# Patient Record
Sex: Female | Born: 1950
Health system: Southern US, Community
[De-identification: ages and names within clinical notes are randomized; demographics above are authoritative.]

## PROBLEM LIST (undated history)

## (undated) DIAGNOSIS — K3580 Unspecified acute appendicitis: Secondary | ICD-10-CM

## (undated) DIAGNOSIS — E079 Disorder of thyroid, unspecified: Secondary | ICD-10-CM

## (undated) HISTORY — DX: Disorder of thyroid, unspecified: E07.9

## (undated) HISTORY — PX: ABDOMINAL HYSTERECTOMY: SHX81

---

## 2002-04-09 ENCOUNTER — Other Ambulatory Visit: Admission: RE | Admit: 2002-04-09 | Discharge: 2002-04-09 | Payer: Self-pay | Admitting: Obstetrics and Gynecology

## 2004-01-23 ENCOUNTER — Ambulatory Visit (HOSPITAL_BASED_OUTPATIENT_CLINIC_OR_DEPARTMENT_OTHER): Admission: RE | Admit: 2004-01-23 | Discharge: 2004-01-23 | Payer: Self-pay | Admitting: Otolaryngology

## 2004-01-23 ENCOUNTER — Ambulatory Visit (HOSPITAL_COMMUNITY): Admission: RE | Admit: 2004-01-23 | Discharge: 2004-01-23 | Payer: Self-pay | Admitting: Otolaryngology

## 2005-08-19 ENCOUNTER — Ambulatory Visit (HOSPITAL_COMMUNITY): Admission: RE | Admit: 2005-08-19 | Discharge: 2005-08-19 | Payer: Self-pay | Admitting: *Deleted

## 2010-08-22 ENCOUNTER — Encounter: Payer: Self-pay | Admitting: Internal Medicine

## 2012-06-07 ENCOUNTER — Other Ambulatory Visit: Payer: Self-pay | Admitting: Internal Medicine

## 2013-06-06 ENCOUNTER — Other Ambulatory Visit: Payer: Self-pay | Admitting: Internal Medicine

## 2013-06-06 DIAGNOSIS — R7989 Other specified abnormal findings of blood chemistry: Secondary | ICD-10-CM

## 2013-06-10 ENCOUNTER — Ambulatory Visit
Admission: RE | Admit: 2013-06-10 | Discharge: 2013-06-10 | Disposition: A | Discharge status: Home / Self Care | Payer: 59 | Source: Ambulatory Visit | Attending: Internal Medicine | Admitting: Internal Medicine

## 2013-06-10 DIAGNOSIS — R7989 Other specified abnormal findings of blood chemistry: Secondary | ICD-10-CM

## 2015-04-08 ENCOUNTER — Encounter: Payer: Self-pay | Admitting: Physician Assistant

## 2015-04-08 ENCOUNTER — Ambulatory Visit (INDEPENDENT_AMBULATORY_CARE_PROVIDER_SITE_OTHER): Payer: BLUE CROSS/BLUE SHIELD | Admitting: Physician Assistant

## 2015-04-08 VITALS — BP 128/82 | HR 65 | Ht 67.5 in | Wt 165.0 lb

## 2015-04-08 DIAGNOSIS — E039 Hypothyroidism, unspecified: Secondary | ICD-10-CM | POA: Diagnosis not present

## 2015-04-08 DIAGNOSIS — Z131 Encounter for screening for diabetes mellitus: Secondary | ICD-10-CM

## 2015-04-08 DIAGNOSIS — Z13220 Encounter for screening for lipoid disorders: Secondary | ICD-10-CM | POA: Diagnosis not present

## 2015-04-08 DIAGNOSIS — R600 Localized edema: Secondary | ICD-10-CM | POA: Insufficient documentation

## 2015-04-08 DIAGNOSIS — Z1239 Encounter for other screening for malignant neoplasm of breast: Secondary | ICD-10-CM

## 2015-04-08 DIAGNOSIS — Z23 Encounter for immunization: Secondary | ICD-10-CM

## 2015-04-08 DIAGNOSIS — Z114 Encounter for screening for human immunodeficiency virus [HIV]: Secondary | ICD-10-CM

## 2015-04-08 DIAGNOSIS — Z1159 Encounter for screening for other viral diseases: Secondary | ICD-10-CM

## 2015-04-08 DIAGNOSIS — Z0000 Encounter for general adult medical examination without abnormal findings: Secondary | ICD-10-CM

## 2015-04-08 LAB — CBC WITH DIFFERENTIAL/PLATELET
BASOS ABS: 0 10*3/uL (ref 0.0–0.1)
Basophils Relative: 0 % (ref 0–1)
EOS PCT: 2 % (ref 0–5)
Eosinophils Absolute: 0.1 10*3/uL (ref 0.0–0.7)
HEMATOCRIT: 35.6 % — AB (ref 36.0–46.0)
Hemoglobin: 11.7 g/dL — ABNORMAL LOW (ref 12.0–15.0)
LYMPHS ABS: 2 10*3/uL (ref 0.7–4.0)
LYMPHS PCT: 37 % (ref 12–46)
MCH: 29.1 pg (ref 26.0–34.0)
MCHC: 32.9 g/dL (ref 30.0–36.0)
MCV: 88.6 fL (ref 78.0–100.0)
MPV: 9.5 fL (ref 8.6–12.4)
Monocytes Absolute: 0.6 10*3/uL (ref 0.1–1.0)
Monocytes Relative: 11 % (ref 3–12)
NEUTROS PCT: 50 % (ref 43–77)
Neutro Abs: 2.7 10*3/uL (ref 1.7–7.7)
PLATELETS: 290 10*3/uL (ref 150–400)
RBC: 4.02 MIL/uL (ref 3.87–5.11)
RDW: 13.6 % (ref 11.5–15.5)
WBC: 5.3 10*3/uL (ref 4.0–10.5)

## 2015-04-08 LAB — LIPID PANEL
CHOL/HDL RATIO: 4.5 ratio (ref <=5.0)
Cholesterol: 227 mg/dL — ABNORMAL HIGH (ref 125–200)
HDL: 51 mg/dL (ref >=46)
LDL CALC: 154 mg/dL — AB (ref <130)
TRIGLYCERIDES: 110 mg/dL (ref <150)
VLDL: 22 mg/dL (ref <30)

## 2015-04-08 LAB — COMPLETE METABOLIC PANEL WITH GFR
ALT: 16 U/L (ref 6–29)
AST: 20 U/L (ref 10–35)
Albumin: 4.2 g/dL (ref 3.6–5.1)
Alkaline Phosphatase: 65 U/L (ref 33–130)
BILIRUBIN TOTAL: 0.4 mg/dL (ref 0.2–1.2)
BUN: 14 mg/dL (ref 7–25)
CHLORIDE: 102 mmol/L (ref 98–110)
CO2: 24 mmol/L (ref 20–31)
Calcium: 9.7 mg/dL (ref 8.6–10.4)
Creat: 1.11 mg/dL — ABNORMAL HIGH (ref 0.50–0.99)
GFR, EST AFRICAN AMERICAN: 62 mL/min (ref >=60)
GFR, EST NON AFRICAN AMERICAN: 53 mL/min — AB (ref >=60)
GLUCOSE: 86 mg/dL (ref 65–99)
POTASSIUM: 3.7 mmol/L (ref 3.5–5.3)
SODIUM: 140 mmol/L (ref 135–146)
Total Protein: 6.8 g/dL (ref 6.1–8.1)

## 2015-04-08 LAB — TSH: TSH: 2.029 u[IU]/mL (ref 0.350–4.500)

## 2015-04-08 NOTE — Progress Notes (Signed)
   Subjective:    Patient ID: Susan Figueroa, female    DOB: January 08, 1951, 64 y.o.   MRN: [id number]  HPI Patient is a 64 year old female who presents to the clinic to establish care.  She has no ongoing concerns today. She is just here to establish a PCP and make sure her health maintenance is up-to-date.  . Active Ambulatory Problems    Diagnosis Date Noted  . Thyroid activity decreased 04/08/2015  . Lower leg edema 04/08/2015   Resolved Ambulatory Problems    Diagnosis Date Noted  . No Resolved Ambulatory Problems   Past Medical History  Diagnosis Date  . Thyroid disease      ..  .. Family History  Problem Relation Age of Onset  . Cancer Mother     cervical  . Hypertension Brother   . Stroke Maternal Grandmother   . Heart attack Maternal Grandfather    .[name] Social History   Social History  . Marital Status: Married    Spouse Name: N/A  . Number of Children: N/A  . Years of Education: N/A   Occupational History  . Not on file.   Social History Main Topics  . Smoking status: Former Research scientist (life sciences)  . Smokeless tobacco: Not on file  . Alcohol Use: No  . Drug Use: No  . Sexual Activity: Yes   Other Topics Concern  . Not on file   Social History Narrative  . No narrative on file      Review of Systems  All other systems reviewed and are negative.      Objective:   Physical Exam  Constitutional: She is oriented to person, place, and time. She appears well-nourished.  HENT:  Head: Normocephalic and atraumatic.  Neck: Normal range of motion. Neck supple. No thyromegaly present.  Cardiovascular: Normal rate, regular rhythm and normal heart sounds.   Pulmonary/Chest: Effort normal and breath sounds normal.  Neurological: She is alert and oriented to person, place, and time.  Skin: Skin is dry.  Psychiatric: She has a normal mood and affect. Her behavior is normal.          Assessment & Plan:  Hypothyroidism-we'll check TSH today and adjust meds  accordingly.  Lower leg edema-well-controlled today. Patient takes HCTZ daily. Will refill as needed.  Flu shot andTdap given today.   Mammogram was ordered today. Patient aware she needs a Pap smear. Next year she will be due for colonoscopy.  Screening labs ordered for patient as well as fasting lipid panel.

## 2015-04-09 LAB — VITAMIN D 25 HYDROXY (VIT D DEFICIENCY, FRACTURES): Vit D, 25-Hydroxy: 38 ng/mL (ref 30–100)

## 2015-04-09 LAB — HIV ANTIBODY (ROUTINE TESTING W REFLEX): HIV 1&2 Ab, 4th Generation: NONREACTIVE

## 2015-04-09 LAB — HEPATITIS C ANTIBODY: HCV Ab: NEGATIVE

## 2015-04-10 ENCOUNTER — Encounter: Payer: Self-pay | Admitting: Physician Assistant

## 2015-04-10 DIAGNOSIS — E785 Hyperlipidemia, unspecified: Secondary | ICD-10-CM | POA: Insufficient documentation

## 2015-04-10 DIAGNOSIS — N183 Chronic kidney disease, stage 3 unspecified: Secondary | ICD-10-CM | POA: Insufficient documentation

## 2015-05-04 ENCOUNTER — Ambulatory Visit (INDEPENDENT_AMBULATORY_CARE_PROVIDER_SITE_OTHER): Payer: BLUE CROSS/BLUE SHIELD | Admitting: Physician Assistant

## 2015-05-04 ENCOUNTER — Encounter: Payer: Self-pay | Admitting: Physician Assistant

## 2015-05-04 VITALS — BP 127/74 | HR 62 | Ht 67.5 in | Wt 162.0 lb

## 2015-05-04 DIAGNOSIS — R5383 Other fatigue: Secondary | ICD-10-CM | POA: Diagnosis not present

## 2015-05-04 DIAGNOSIS — I7300 Raynaud's syndrome without gangrene: Secondary | ICD-10-CM

## 2015-05-04 DIAGNOSIS — Z0000 Encounter for general adult medical examination without abnormal findings: Secondary | ICD-10-CM | POA: Diagnosis not present

## 2015-05-04 DIAGNOSIS — R0683 Snoring: Secondary | ICD-10-CM

## 2015-05-04 NOTE — Patient Instructions (Addendum)
Keeping You Healthy  Get These Tests  Blood Pressure- Have your blood pressure checked by your healthcare provider at least once a year.  Normal blood pressure is 120/80.  Weight- Have your body mass index (BMI) calculated to screen for obesity.  BMI is a measure of body fat based on height and weight.  You can calculate your own BMI at www.nhlbisupport.com/bmi/  Cholesterol- Have your cholesterol checked every year.  Diabetes- Have your blood sugar checked every year if you have high blood pressure, high cholesterol, a family history of diabetes or if you are overweight.  Pap Test - Have a pap test every 1 to 5 years if you have been sexually active.  If you are older than 65 and recent pap tests have been normal you may not need additional pap tests.  In addition, if you have had a hysterectomy  for benign disease additional pap tests are not necessary.  Mammogram-Yearly mammograms are essential for early detection of breast cancer  Screening for Colon Cancer- Colonoscopy starting at age 50. Screening may begin sooner depending on your family history and other health conditions.  Follow up colonoscopy as directed by your Gastroenterologist.  Screening for Osteoporosis- Screening begins at age 65 with bone density scanning, sooner if you are at higher risk for developing Osteoporosis.  Get these medicines  Calcium with Vitamin D- Your body requires 1200-1500 mg of Calcium a day and 800-1000 IU of Vitamin D a day.  You can only absorb 500 mg of Calcium at a time therefore Calcium must be taken in 2 or 3 separate doses throughout the day.  Hormones- Hormone therapy has been associated with increased risk for certain cancers and heart disease.  Talk to your healthcare provider about if you need relief from menopausal symptoms.  Aspirin- Ask your healthcare provider about taking Aspirin to prevent Heart Disease and Stroke.  Get these Immuniztions  Flu shot- Every fall  Pneumonia shot-  Once after the age of 65; if you are younger ask your healthcare provider if you need a pneumonia shot.  Tetanus- Every ten years.  Zostavax- Once after the age of 60 to prevent shingles.  Take these steps  Don't smoke- Your healthcare provider can help you quit. For tips on how to quit, ask your healthcare provider or go to www.smokefree.gov or call 1-800 QUIT-NOW.  Be physically active- Exercise 5 days a week for a minimum of 30 minutes.  If you are not already physically active, start slow and gradually work up to 30 minutes of moderate physical activity.  Try walking, dancing, bike riding, swimming, etc.  Eat a healthy diet- Eat a variety of healthy foods such as fruits, vegetables, whole grains, low fat milk, low fat cheeses, yogurt, lean meats, chicken, fish, eggs, dried beans, tofu, etc.  For more information go to www.thenutritionsource.org  Dental visit- Brush and floss teeth twice daily; visit your dentist twice a year.  Eye exam- Visit your Optometrist or Ophthalmologist yearly.  Drink alcohol in moderation- Limit alcohol intake to one drink or less a day.  Never drink and drive.  Depression- Your emotional health is as important as your physical health.  If you're feeling down or losing interest in things you normally enjoy, please talk to your healthcare provider.  Seat Belts- can save your life; always wear one  Smoke/Carbon Monoxide detectors- These detectors need to be installed on the appropriate level of your home.  Replace batteries at least once a year.  Violence- If   anyone is threatening or hurting you, please tell your healthcare provider.  Living Will/ Health care power of attorney- Discuss with your healthcare provider and family.  Raynaud's Syndrome Raynaud's Syndrome is a disorder of the blood vessels in your hands and feet. It occurs when small arteries of the arms/hands or legs/feet become sensitive to cold or emotional upset. This causes the arteries to  constrict, or narrow, and reduces blood flow to the area. The color in the fingers or toes changes from white to bluish to red and this is not usually painful. There may be numbness and tingling. Sores on the skin (ulcers) can form. Symptoms are usually relieved by warming. HOME CARE INSTRUCTIONS   Avoid exposure to cold. Keep your whole body warm and dry. Dress in layers. Wear mittens or gloves when handling ice or frozen food and when outdoors. Use holders for glasses or cans containing cold drinks. If possible, stay indoors during cold weather.  Limit your use of caffeine. Switch to decaffeinated coffee, tea, and soda pop. Avoid chocolate.  Avoid smoking or being around cigarette smoke. Smoke will make symptoms worse.  Wear loose fitting socks and comfortable, roomy shoes.  Avoid vibrating tools and machinery.  If possible, avoid stressful and emotional situations. Exercise, meditation and yoga may help you cope with stress. Biofeedback may be useful.  Ask your caregiver about medicine (calcium channel blockers) that may control Raynaud's phenomena. SEEK MEDICAL CARE IF:   Your discomfort becomes worse, despite conservative treatment.  You develop sores on your fingers and toes that do not heal. Document Released: 07/15/2000 Document Revised: 10/10/2011 Document Reviewed: 07/22/2008 [facility] Patient Information 2015 [place], [place]. This information is not intended to replace advice given to you by your health care provider. Make sure you discuss any questions you have with your health care provider.

## 2015-05-04 NOTE — Progress Notes (Signed)
Subjective:    Patient ID: Susan Figueroa, female    DOB: 1951/06/06, 64 y.o.   MRN: [id number]  HPI    Review of Systems     Objective:   Physical Exam        Assessment & Plan:   Subjective:     Susan Figueroa is a 64 y.o. female and is here for a comprehensive physical exam. The patient reports problems - pt is concerned about sleep apnea. she snores and has no energy. husband recently dx and feels much better. she also has cold toes all the time. sometimes they tingle a little. keeping warm makes them a little better. .  Social History   Social History  . Marital Status: Married    Spouse Name: N/A  . Number of Children: N/A  . Years of Education: N/A   Occupational History  . Not on file.   Social History Main Topics  . Smoking status: Former Research scientist (life sciences)  . Smokeless tobacco: Not on file  . Alcohol Use: No  . Drug Use: No  . Sexual Activity: Yes   Other Topics Concern  . Not on file   Social History Narrative   Health Maintenance  Topic Date Due  . MAMMOGRAM  11/07/2002  . COLONOSCOPY  08/02/2015  . INFLUENZA VACCINE  03/01/2016  . TETANUS/TDAP  04/07/2025  . ZOSTAVAX  Addressed  . Hepatitis C Screening  Completed  . HIV Screening  Completed    The following portions of the patient's history were reviewed and updated as appropriate: allergies, current medications, past family history, past medical history, past social history, past surgical history and problem list.  Review of Systems Pertinent items noted in HPI and remainder of comprehensive ROS otherwise negative.   Objective:    BP 127/74 mmHg  Pulse 62  Ht 5' 7.5" (1.715 m)  Wt 162 lb (73.483 kg)  BMI 24.98 kg/m2 General appearance: alert, cooperative and appears stated age Head: Normocephalic, without obvious abnormality, atraumatic Eyes: conjunctivae/corneas clear. PERRL, EOM's intact. Fundi benign. Ears: normal TM's and external ear canals both ears Nose: Nares normal. Septum midline.  Mucosa normal. No drainage or sinus tenderness. Throat: lips, mucosa, and tongue normal; teeth and gums normal Neck: no adenopathy, no carotid bruit, no JVD, supple, symmetrical, trachea midline and thyroid not enlarged, symmetric, no tenderness/mass/nodules Back: symmetric, no curvature. ROM normal. No CVA tenderness. Lungs: clear to auscultation bilaterally Heart: regular rate and rhythm, S1, S2 normal, no murmur, click, rub or gallop Abdomen: soft, non-tender; bowel sounds normal; no masses,  no organomegaly Pelvic: external genitalia normal, no adnexal masses or tenderness, no cervical motion tenderness, uterus surgically absent and vagina normal without discharge Extremities: extremities normal, atraumatic, no cyanosis or edema Pulses: 2+ and symmetric Skin: Skin color, texture, turgor normal. No rashes or lesions Lymph nodes: Cervical, supraclavicular, and axillary nodes normal. Neurologic: Grossly normal    Assessment:    Healthy female exam.      Plan:    CPE- no need for pap. Mammogram scheduled for next week. Stool cards these year and colonoscopy next year. Fasting labs discussed. LDL a little elevated. Will try diet and exercise changes and recheck in 6 months to 1 year. Encouraged exercise. Vitamin D 800 units and calcium 1500mg  discussed.   Snoring/no energy- STOP BANG High Risk Score. Will send for sleep study. Follow up for results.   raynauds- discussed CCB. Pt declined today. Gave information in HO.  See After Visit Summary for  Counseling Recommendations

## 2015-05-06 ENCOUNTER — Ambulatory Visit (INDEPENDENT_AMBULATORY_CARE_PROVIDER_SITE_OTHER): Payer: BLUE CROSS/BLUE SHIELD

## 2015-05-06 DIAGNOSIS — Z1239 Encounter for other screening for malignant neoplasm of breast: Secondary | ICD-10-CM

## 2015-05-06 DIAGNOSIS — Z1231 Encounter for screening mammogram for malignant neoplasm of breast: Secondary | ICD-10-CM | POA: Diagnosis not present

## 2015-06-30 ENCOUNTER — Encounter: Payer: Self-pay | Admitting: Physician Assistant

## 2015-06-30 ENCOUNTER — Telehealth: Payer: Self-pay | Admitting: Physician Assistant

## 2015-06-30 DIAGNOSIS — G4730 Sleep apnea, unspecified: Secondary | ICD-10-CM | POA: Insufficient documentation

## 2015-06-30 NOTE — Telephone Encounter (Signed)
Call pt: minimal obstructive sleep apnea, elevated arousal and awakening index. The recommendations by the study were no treatment at this time however close monitoring due to increased risk of developing more severe obstructive sleep apnea that could need treatment. We need to continue to make sure you're weight remains controlled and there is no development of heart or brain disease.

## 2015-07-02 NOTE — Telephone Encounter (Signed)
LMOM notifying pt of results & recommendations.  

## 2015-10-19 ENCOUNTER — Encounter: Payer: Self-pay | Admitting: Physician Assistant

## 2015-10-19 ENCOUNTER — Ambulatory Visit (INDEPENDENT_AMBULATORY_CARE_PROVIDER_SITE_OTHER): Payer: BLUE CROSS/BLUE SHIELD | Admitting: Physician Assistant

## 2015-10-19 VITALS — BP 122/84 | HR 72 | Ht 67.5 in | Wt 172.0 lb

## 2015-10-19 DIAGNOSIS — R635 Abnormal weight gain: Secondary | ICD-10-CM

## 2015-10-19 DIAGNOSIS — E039 Hypothyroidism, unspecified: Secondary | ICD-10-CM | POA: Diagnosis not present

## 2015-10-19 DIAGNOSIS — L853 Xerosis cutis: Secondary | ICD-10-CM | POA: Diagnosis not present

## 2015-10-19 DIAGNOSIS — R682 Dry mouth, unspecified: Secondary | ICD-10-CM

## 2015-10-19 NOTE — Progress Notes (Signed)
   Subjective:    Patient ID: Susan Figueroa, female    DOB: 11-02-1950, 65 y.o.   MRN: [id number]  HPI  Pt is a 65 yo female who presents to the clinic for hypothyroidism follow up. Last checked in October and looked great. In December she started to have some symptoms. Her whole body seems dry. First thing she noticed were her feet. When she went to get a pedicure the lady commented "your feet are very dry". She has noticed more callus on bunion and just can't get them moist no matter what she does with vasoline, lotions, soaks, etc. She also has notice a weight gain of 10lbs since October and she is exercising and eating a mediterranean diet. She also has dry, itchy skin of her face. She is wash her face, putting baby oil and coating in vasoline every night and her skin is still itchy, dry, and fine scales. She has not changed any make up and uses very little. She also notice dry mouth. She used to not have to drink and eat but not she has to drink or food feels like getting stuck. Denies any eye dryness, itching or changes. She feels like could be hypothyroidism not controlled.    Review of Systems  All other systems reviewed and are negative.      Objective:   Physical Exam  Constitutional: She is oriented to person, place, and time. She appears well-developed and well-nourished.  HENT:  Head: Normocephalic and atraumatic.  Right Ear: External ear normal.  Left Ear: External ear normal.  Nose: Nose normal.  Mouth/Throat: No oropharyngeal exudate.  TM's clear.  Oropharynx appears dry.  Eyes: Conjunctivae are normal. Right eye exhibits no discharge. Left eye exhibits no discharge.  Neck: Normal range of motion. Neck supple. No thyromegaly present.  Cardiovascular: Normal rate, regular rhythm and normal heart sounds.   Pulmonary/Chest: Effort normal and breath sounds normal. She has no wheezes.  Lymphadenopathy:    She has no cervical adenopathy.  Neurological: She is alert and oriented  to person, place, and time.  Skin:  Fine scales over jaw line.   Bilateral feet callused with hypertrophic skin on heels and balls of feet.   Psychiatric: She has a normal mood and affect. Her behavior is normal.          Assessment & Plan:  Weight gain/dry skin/dry mouth/hypothyroidism- will check TSH, free levels, and antibiodies. certainly could represent these symptoms. Concerned with Sjorgens syndrome. Will get more labs if first do not result in cause. Continue with treatment of dry skin. Will consider stopping HCTZ could be drying out skin and mouth. She takes for lower leg edema.  CKD- last labs Cr was off will recheck today.

## 2015-10-20 DIAGNOSIS — L853 Xerosis cutis: Secondary | ICD-10-CM | POA: Insufficient documentation

## 2015-10-20 DIAGNOSIS — R635 Abnormal weight gain: Secondary | ICD-10-CM | POA: Insufficient documentation

## 2015-10-20 DIAGNOSIS — R682 Dry mouth, unspecified: Secondary | ICD-10-CM | POA: Insufficient documentation

## 2015-10-20 LAB — COMPLETE METABOLIC PANEL WITH GFR
ALT: 17 U/L (ref 6–29)
AST: 16 U/L (ref 10–35)
Albumin: 4.2 g/dL (ref 3.6–5.1)
Alkaline Phosphatase: 88 U/L (ref 33–130)
BUN: 17 mg/dL (ref 7–25)
CALCIUM: 9.6 mg/dL (ref 8.6–10.4)
CHLORIDE: 103 mmol/L (ref 98–110)
CO2: 29 mmol/L (ref 20–31)
CREATININE: 1.16 mg/dL — AB (ref 0.50–0.99)
GFR, EST AFRICAN AMERICAN: 58 mL/min — AB (ref >=60)
GFR, EST NON AFRICAN AMERICAN: 51 mL/min — AB (ref >=60)
Glucose, Bld: 85 mg/dL (ref 65–99)
POTASSIUM: 3.9 mmol/L (ref 3.5–5.3)
Sodium: 137 mmol/L (ref 135–146)
Total Bilirubin: 0.3 mg/dL (ref 0.2–1.2)
Total Protein: 6.8 g/dL (ref 6.1–8.1)

## 2015-10-20 LAB — T3, FREE: T3 FREE: 2.7 pg/mL (ref 2.3–4.2)

## 2015-10-20 LAB — THYROID ANTIBODIES: Thyroperoxidase Ab SerPl-aCnc: <1 IU/mL (ref <9)

## 2015-10-20 LAB — T4, FREE: FREE T4: 1.4 ng/dL (ref 0.8–1.8)

## 2015-10-20 LAB — TSH: TSH: 2.38 m[IU]/L

## 2015-10-21 NOTE — Addendum Note (Signed)
Addended by: [name] on: 10/21/2015 05:21 PM   Modules accepted: Orders

## 2015-10-22 ENCOUNTER — Telehealth: Payer: Self-pay | Admitting: Physician Assistant

## 2015-10-22 NOTE — Telephone Encounter (Signed)
Patient called and adv that she does not want to have any more testing done and would like to wait 4 weeks and then decide if she wants testing done at that time. Thanks

## 2015-10-23 NOTE — Telephone Encounter (Signed)
Sounds good

## 2015-12-23 ENCOUNTER — Ambulatory Visit (INDEPENDENT_AMBULATORY_CARE_PROVIDER_SITE_OTHER): Payer: BLUE CROSS/BLUE SHIELD | Admitting: Physician Assistant

## 2015-12-23 ENCOUNTER — Encounter: Payer: Self-pay | Admitting: Physician Assistant

## 2015-12-23 VITALS — BP 142/79 | HR 63 | Ht 67.5 in | Wt 175.0 lb

## 2015-12-23 DIAGNOSIS — R32 Unspecified urinary incontinence: Secondary | ICD-10-CM

## 2015-12-23 MED ORDER — OXYBUTYNIN CHLORIDE ER 10 MG PO TB24: 10.0000 mg | ORAL_TABLET | Freq: Every day | ORAL | Status: DC

## 2015-12-23 NOTE — Patient Instructions (Signed)
Kegel Exercises The goal of Kegel exercises is to isolate and exercise your pelvic floor muscles. These muscles act as a hammock that supports the rectum, vagina, small intestine, and uterus. As the muscles weaken, the hammock sags and these organs are displaced from their normal positions. Kegel exercises can strengthen your pelvic floor muscles and help you to improve bladder and bowel control, improve sexual response, and help reduce many problems and some discomfort during pregnancy. Kegel exercises can be done anywhere and at any time. HOW TO PERFORM Gregory your pelvic floor muscles. To do this, squeeze (contract) the muscles that you use when you try to stop the flow of urine. You will feel a tightness in the vaginal area (women) and a tight lift in the rectal area (men and women). When you begin, contract your pelvic muscles tight for 2-5 seconds, then relax them for 2-5 seconds. This is one set. Do 4-5 sets with a short pause in between. Contract your pelvic muscles for 8-10 seconds, then relax them for 8-10 seconds. Do 4-5 sets. If you cannot contract your pelvic muscles for 8-10 seconds, try 5-7 seconds and work your way up to 8-10 seconds. Your goal is 4-5 sets of 10 contractions each day. Keep your stomach, buttocks, and legs relaxed during the exercises. Perform sets of both short and long contractions. Vary your positions. Perform these contractions 3-4 times per day. Perform sets while you are:  Lying in bed in the morning. Standing at lunch. Sitting in the late afternoon. Lying in bed at night. You should do 40-50 contractions per day. Do not perform more Kegel exercises per day than recommended. Overexercising can cause muscle fatigue. Continue these exercises for for at least 15-20 weeks or as directed by your caregiver.   This information is not intended to replace advice given to you by your health care provider. Make sure you discuss any questions you have with  your health care provider.   Document Released: 07/04/2012 Document Revised: 08/08/2014 Document Reviewed: 07/04/2012 Elsevier Interactive Patient Education 2016 [employer] Urinary Incontinence Urinary incontinence is the involuntary loss of urine from your bladder. CAUSES  There are many causes of urinary incontinence. They include:  Medicines.  Infections.  Prostatic enlargement, leading to overflow of urine from your bladder.  Surgery.  Neurological diseases.  Emotional factors. SIGNS AND SYMPTOMS Urinary Incontinence can be divided into four types: 1. Urge incontinence. Urge incontinence is the involuntary loss of urine before you have the opportunity to go to the bathroom. There is a sudden urge to void but not enough time to reach a bathroom. 2. Stress incontinence. Stress incontinence is the sudden loss of urine with any activity that forces urine to pass. It is commonly caused by anatomical changes to the pelvis and sphincter areas of your body. 3. Overflow incontinence. Overflow incontinence is the loss of urine from an obstructed opening to your bladder. This results in a backup of urine and a resultant buildup of pressure within the bladder. When the pressure within the bladder exceeds the closing pressure of the sphincter, the urine overflows, which causes incontinence, similar to water overflowing a dam. 4. Total incontinence. Total incontinence is the loss of urine as a result of the inability to store urine within your bladder. DIAGNOSIS  Evaluating the cause of incontinence may require:  A thorough and complete medical and obstetric history.  A complete physical exam.  Laboratory tests such as a urine culture and sensitivities. When additional tests are  indicated, they can include:  An ultrasound exam.  Kidney and bladder X-rays.  Cystoscopy. This is an exam of the bladder using a narrow scope.  Urodynamic testing to test the nerve function to the bladder  and sphincter areas. TREATMENT  Treatment for urinary incontinence depends on the cause:  For urge incontinence caused by a bacterial infection, antibiotics will be prescribed. If the urge incontinence is related to medicines you take, your health care provider may have you change the medicine.  For stress incontinence, surgery to re-establish anatomical support to the bladder or sphincter, or both, will often correct the condition.  For overflow incontinence caused by an enlarged prostate, an operation to open the channel through the enlarged prostate will allow the flow of urine out of the bladder. In women with fibroids, a hysterectomy may be recommended.  For total incontinence, surgery on your urinary sphincter may help. An artificial urinary sphincter (an inflatable cuff placed around the urethra) may be required. In women who have developed a hole-like passage between their bladder and vagina (vesicovaginal fistula), surgery to close the fistula often is required. HOME CARE INSTRUCTIONS  Normal daily hygiene and the use of pads or adult diapers that are changed regularly will help prevent odors and skin damage.  Avoid caffeine. It can overstimulate your bladder.  Use the bathroom regularly. Try about every 2-3 hours to go to the bathroom, even if you do not feel the need to do so. Take time to empty your bladder completely. After urinating, wait a minute. Then try to urinate again.  For causes involving nerve dysfunction, keep a log of the medicines you take and a journal of the times you go to the bathroom. SEEK MEDICAL CARE IF:  You experience worsening of pain instead of improvement in pain after your procedure.  Your incontinence becomes worse instead of better. SEE IMMEDIATE MEDICAL CARE IF:  You experience fever or shaking chills.  You are unable to pass your urine.  You have redness spreading into your groin or down into your thighs. MAKE SURE YOU:   Understand these  instructions.   Will watch your condition.  Will get help right away if you are not doing well or get worse.   This information is not intended to replace advice given to you by your health care provider. Make sure you discuss any questions you have with your health care provider.   Document Released: 08/25/2004 Document Revised: 08/08/2014 Document Reviewed: 12/25/2012 Elsevier Interactive Patient Education [employer].

## 2015-12-23 NOTE — Progress Notes (Signed)
   Subjective:    Patient ID: Susan Figueroa, female    DOB: July 14, 1951, 65 y.o.   MRN: [id number]  HPI  Pt is a 65 yo female who presents to the clinic with over a year of urinary leakage. She has stopped caffiene to only one cup a day. She denies any dysuria. She at times goes a little more frequent. No abdominal pressure or pain. A lot of times she can just twist the wrong way and "leak a little". She would states this is happening about 6 times a day. She is not urinating more frequently at night.     Review of Systems  All other systems reviewed and are negative.      Objective:   Physical Exam  Constitutional: She is oriented to person, place, and time. She appears well-developed and well-nourished.  HENT:  Head: Normocephalic and atraumatic.  Cardiovascular: Normal rate, regular rhythm and normal heart sounds.   Pulmonary/Chest: Effort normal and breath sounds normal.  Abdominal: Soft. Bowel sounds are normal. She exhibits no distension and no mass. There is no tenderness. There is no rebound and no guarding.  Neurological: She is alert and oriented to person, place, and time.  Skin: Skin is dry.  Psychiatric: She has a normal mood and affect. Her behavior is normal.          Assessment & Plan:  Urinary incontinence discussed with patient causes of this in females later on in life. Encouraged her to start doing kegel exercises. Discussed medications. Gave HO with symptomatic things to do to help. UA was normal.  Started ditropan. MOA and SE's discussed.  Follow up in 2 months.

## 2015-12-25 DIAGNOSIS — R32 Unspecified urinary incontinence: Secondary | ICD-10-CM | POA: Insufficient documentation

## 2016-04-07 ENCOUNTER — Other Ambulatory Visit: Payer: Self-pay | Admitting: Physician Assistant

## 2016-04-07 DIAGNOSIS — Z1231 Encounter for screening mammogram for malignant neoplasm of breast: Secondary | ICD-10-CM

## 2016-04-15 ENCOUNTER — Encounter: Payer: Self-pay | Admitting: Physician Assistant

## 2016-04-15 ENCOUNTER — Ambulatory Visit (INDEPENDENT_AMBULATORY_CARE_PROVIDER_SITE_OTHER): Payer: BLUE CROSS/BLUE SHIELD | Admitting: Physician Assistant

## 2016-04-15 ENCOUNTER — Ambulatory Visit: Payer: BLUE CROSS/BLUE SHIELD | Admitting: Sports Medicine

## 2016-04-15 VITALS — BP 142/92 | HR 69 | Ht 67.5 in | Wt 173.0 lb

## 2016-04-15 DIAGNOSIS — N3020 Other chronic cystitis without hematuria: Secondary | ICD-10-CM | POA: Diagnosis not present

## 2016-04-15 DIAGNOSIS — R109 Unspecified abdominal pain: Secondary | ICD-10-CM

## 2016-04-15 DIAGNOSIS — R300 Dysuria: Secondary | ICD-10-CM

## 2016-04-15 DIAGNOSIS — N390 Urinary tract infection, site not specified: Secondary | ICD-10-CM | POA: Diagnosis not present

## 2016-04-15 DIAGNOSIS — R82998 Other abnormal findings in urine: Secondary | ICD-10-CM

## 2016-04-15 LAB — POCT URINALYSIS DIPSTICK
BILIRUBIN UA: NEGATIVE
GLUCOSE UA: NEGATIVE
Ketones, UA: NEGATIVE
Nitrite, UA: NEGATIVE
Protein, UA: NEGATIVE
RBC UA: NEGATIVE
SPEC GRAV UA: 1.01
UROBILINOGEN UA: 0.2
pH, UA: 7

## 2016-04-15 NOTE — Patient Instructions (Addendum)
Interstitial Cystitis Interstitial cystitis is a condition that causes inflammation of the bladder. The bladder is a hollow organ in the lower part of your abdomen. It stores urine after the urine is made by your kidneys. With interstitial cystitis, you may have pain in the bladder area. You may also have a frequent and urgent need to urinate. The severity of interstitial cystitis can vary from person to person. You may have flare-ups of the condition, and then it may go away for a while. For many people who have this condition, it becomes a long-term problem. CAUSES The cause of this condition is not known. RISK FACTORS This condition is more likely to develop in women. SYMPTOMS Symptoms of interstitial cystitis vary, and they can change over time. Symptoms may include:  Discomfort or pain in the bladder area. This can range from mild to severe. The pain may change in intensity as the bladder fills with urine or as it empties.  Pelvic pain.  An urgent need to urinate.  Frequent urination.  Pain during sexual intercourse.  Pinpoint bleeding on the bladder wall. For women, the symptoms often get worse during menstruation. DIAGNOSIS This condition is diagnosed by evaluating your symptoms and ruling out other causes. A physical exam will be done. Various tests may be done to rule out other conditions. Common tests include:  Urine tests.  Cystoscopy. In this test, a tool that is like a very thin telescope is used to look into your bladder.  Biopsy. This involves taking a sample of tissue from the bladder wall to be examined under a microscope. TREATMENT There is no cure for interstitial cystitis, but treatment methods are available to control your symptoms. Work closely with your health care provider to find the treatments that will be most effective for you. Treatment options may include:  Medicines to relieve pain and to help reduce the number of times that you feel the need to  urinate.  Bladder training. This involves learning ways to control when you urinate, such as:  Urinating at scheduled times.  Training yourself to delay urination.  Doing exercises (Kegel exercises) to strengthen the muscles that control urine flow.  Lifestyle changes, such as changing your diet or taking steps to control stress.  Use of a device that provides electrical stimulation in order to reduce pain.  A procedure that stretches your bladder by filling it with air or fluid.  Surgery. This is rare. It is only done for extreme cases if other treatments do not help. HOME CARE INSTRUCTIONS  Take medicines only as directed by your health care provider.  Use bladder training techniques as directed.  Keep a bladder diary to find out which foods, liquids, or activities make your symptoms worse.  Use your bladder diary to schedule bathroom trips. If you are away from home, plan to be near a bathroom at each of your scheduled times.  Make sure you urinate just before you leave the house and just before you go to bed.  Do Kegel exercises as directed by your health care provider.  Do not drink alcohol.  Do not use any tobacco products, including cigarettes, chewing tobacco, or electronic cigarettes. If you need help quitting, ask your health care provider.  Make dietary changes as directed by your health care provider. You may need to avoid spicy foods and foods that contain a high amount of potassium.  Limit your drinking of beverages that stimulate urination. These include soda, coffee, and tea.  Keep all follow-up   visits as directed by your health care provider. This is important. SEEK MEDICAL CARE IF:  Your symptoms do not get better after treatment.  Your pain and discomfort are getting worse.  You have more frequent urges to urinate.  You have a fever. SEEK IMMEDIATE MEDICAL CARE IF:  You are not able to control your bladder at all.   This information is not  intended to replace advice given to you by your health care provider. Make sure you discuss any questions you have with your health care provider.   Document Released: 03/18/2004 Document Revised: 08/08/2014 Document Reviewed: 03/25/2014 Elsevier Interactive Patient Education 2016 [employer] capsules What is this medicine? PENTOSAN (PEN toe san) is used to treat the bladder pain or discomfort caused by interstitial cystitis. This is a condition that causes inflammation of parts of the kidney. This medicine may be used for other purposes; ask your health care provider or pharmacist if you have questions. What should I tell my health care provider before I take this medicine? They need to know if you have any of these conditions: -kidney or liver disease -an unusual or allergic reaction to pentosan, other medicines, foods, dyes, or preservatives -pregnant or trying to get pregnant -breast-feeding How should I use this medicine? Take this medicine by mouth with a glass of water. Follow the directions on the prescription label. Take this medicine on an empty stomach, at least 1 hour before or 2 hours after food. Do not take with food. Take your doses at regular intervals. Do not take your medicine more often than directed. Talk to your pediatrician regarding the use of this medicine in children. Special care may be needed. Overdosage: If you think you have taken too much of this medicine contact a poison control center or emergency room at once. NOTE: This medicine is only for you. Do not share this medicine with others. What if I miss a dose? If you miss a dose, take it as soon as you can. If it is almost time for your next dose, take only that dose. Do not take double or extra doses. What may interact with this medicine? Do not take this medicine with any of the following medications: -agents that prevent or dissolve blood clots like heparin or warfarin -aspirin, especially in  higher doses -mifepristone This medicine may also interact with the following medications: -clopidogrel -dipyridamole -NSAIDs, medicines for pain and inflammation, like ibuprofen or naproxen -ticlopidine This list may not describe all possible interactions. Give your health care provider a list of all the medicines, herbs, non-prescription drugs, or dietary supplements you use. Also tell them if you smoke, drink alcohol, or use illegal drugs. Some items may interact with your medicine. What should I watch for while using this medicine? Tell your doctor or health care professional that you are taking this medicine if you are going to have a medical procedure or surgery. What side effects may I notice from receiving this medicine? Side effects that you should report to your doctor or health care professional as soon as possible: -allergic reactions like skin rash, itching or hives, swelling of the face, lips, or tongue -diarrhea -rash -signs and symptoms of bleeding such as bloody or black, tarry stools; red or dark-brown urine; spitting up blood or brown material that looks like coffee grounds; red spots on the skin; unusual bruising or bleeding from the eye, gums, or nose -stomach pain Side effects that usually do not require medical attention (report  to your doctor or health care professional if they continue or are bothersome): -hair loss -headache -nausea This list may not describe all possible side effects. Call your doctor for medical advice about side effects. You may report side effects to FDA at 1-800-FDA-1088. Where should I keep my medicine? Keep out of the reach of children. Store at room temperature between 15 and 30 degrees C (59 and 86 degrees F). Throw away any unused medicine after the expiration date. NOTE: This sheet is a summary. It may not cover all possible information. If you have questions about this medicine, talk to your doctor, pharmacist, or health care provider.     2016, Elsevier/Gold Standard. (2012-11-02 15:36:02)

## 2016-04-15 NOTE — Progress Notes (Addendum)
Subjective:     Patient ID: Susan Figueroa, female   DOB: 29-Jan-1951, 65 y.o.   MRN: [id number]  HPI Patient is a 65 y.o. Caucasian female presenting today with complaints of urinary frequency that has persisted for one month. Patient states that she is currently taking azos and has been for the past month with minor symptom relief. Patient notes that today her symptoms are not as bad (1/10) but that they vary day to day and can reach a 10/10. The patient describes her symptoms as a lower abdominal discomfort and constant sensation that she still has to urinate after going. The patient notes that she has cut consumption of sodas and now is drinking more water and milk. Patient denies fever, flank pain, vaginal discharge, or difficulty initiating a urine stream.  Review of Systems  Constitutional: Negative for activity change, appetite change, chills, diaphoresis, fatigue, fever and unexpected weight change.  Respiratory: Negative for chest tightness, shortness of breath and wheezing.   Cardiovascular: Negative for chest pain.  Gastrointestinal: Positive for abdominal pain. Negative for abdominal distention, constipation, diarrhea, nausea and vomiting.  Genitourinary: Positive for dysuria, frequency, pelvic pain and urgency. Negative for difficulty urinating, flank pain, hematuria and vaginal discharge.       Objective:   Physical Exam  Abdominal: Soft. Normal appearance, normal aorta and bowel sounds are normal. There is no hepatosplenomegaly, splenomegaly or hepatomegaly. There is tenderness in the left lower quadrant. There is no rigidity, no rebound, no guarding and no CVA tenderness.         Assessment:     Susan Figueroa was seen today for dysuria.  Diagnoses and all orders for this visit:  Dysuria -     POCT urinalysis dipstick -     Urine Culture  Abdominal discomfort  Chronic cystitis  Leukocytes in urine -     Urine Culture      Plan:     Dysuria/chronic cystitis/abdominal  discomfort - Patient had UA in office today with some leukocytes noted. Specimen sent to culture. Patient educated on possible interstitial cystitis. Discussed with patient that she should attempt symptomatic relief with bladder training, over-the-counter pain mediations, and lifestyle/dietary modification. Will determine need for further medication intervention upon receiving urine culture. Patient to consider referral to cystitis. Patient to return-to-clinic if symptoms persist or worsen. Will consider [place] if symptoms do not improve.

## 2016-04-15 NOTE — Progress Notes (Signed)
4 a year since a kid. A month

## 2016-04-18 LAB — URINE CULTURE

## 2016-04-18 MED ORDER — NITROFURANTOIN MONOHYD MACRO 100 MG PO CAPS: 100.0000 mg | ORAL_CAPSULE | Freq: Two times a day (BID) | ORAL | 0 refills | Status: DC

## 2016-04-18 NOTE — Addendum Note (Signed)
Addended by: [name] on: 04/18/2016 09:02 AM   Modules accepted: Orders

## 2016-04-27 ENCOUNTER — Other Ambulatory Visit: Payer: Self-pay | Admitting: *Deleted

## 2016-04-27 MED ORDER — LEVOTHYROXINE SODIUM 50 MCG PO TABS
50.0000 ug | ORAL_TABLET | Freq: Every day | ORAL | 3 refills | Status: DC
Start: 2016-04-27 — End: 2016-11-03

## 2016-05-06 ENCOUNTER — Ambulatory Visit (INDEPENDENT_AMBULATORY_CARE_PROVIDER_SITE_OTHER): Payer: BLUE CROSS/BLUE SHIELD

## 2016-05-06 DIAGNOSIS — Z1231 Encounter for screening mammogram for malignant neoplasm of breast: Secondary | ICD-10-CM

## 2016-05-20 DIAGNOSIS — Z0000 Encounter for general adult medical examination without abnormal findings: Secondary | ICD-10-CM | POA: Diagnosis not present

## 2016-06-07 DIAGNOSIS — R05 Cough: Secondary | ICD-10-CM | POA: Diagnosis not present

## 2016-06-07 DIAGNOSIS — J0110 Acute frontal sinusitis, unspecified: Secondary | ICD-10-CM | POA: Diagnosis not present

## 2016-06-13 ENCOUNTER — Emergency Department
Admission: EM | Admit: 2016-06-13 | Discharge: 2016-06-13 | Disposition: A | Discharge status: Home / Self Care | Payer: BLUE CROSS/BLUE SHIELD | Source: Home / Self Care | Attending: Family Medicine | Admitting: Family Medicine

## 2016-06-13 ENCOUNTER — Encounter: Payer: Self-pay | Admitting: Emergency Medicine

## 2016-06-13 DIAGNOSIS — J069 Acute upper respiratory infection, unspecified: Secondary | ICD-10-CM

## 2016-06-13 DIAGNOSIS — H9201 Otalgia, right ear: Secondary | ICD-10-CM | POA: Diagnosis not present

## 2016-06-13 MED ORDER — PREDNISONE 20 MG PO TABS: ORAL_TABLET | ORAL | 0 refills | Status: DC

## 2016-06-13 MED ORDER — AMOXICILLIN-POT CLAVULANATE 875-125 MG PO TABS: 1.0000 | ORAL_TABLET | Freq: Two times a day (BID) | ORAL | 0 refills | Status: DC

## 2016-06-13 MED ORDER — DEXAMETHASONE SODIUM PHOSPHATE 10 MG/ML IJ SOLN: 10.0000 mg | Freq: Once | INTRAMUSCULAR | Status: DC

## 2016-06-13 NOTE — ED Triage Notes (Signed)
URI x 3 weeks, Rt ear is hurting still, she is flying in a few days and is concerned

## 2016-06-13 NOTE — Discharge Instructions (Signed)
°  You were given a shot of decadron (a steroid) today to help with inflammation and hopefully help relieve pressure in your sinuses and Right ear.  You have been prescribed 5 days of prednisone, an oral steroid.  You may start this medication tomorrow with breakfast.    You may also continue to use Flonase or over the counter nasal saline to help keep nasal passages moist.   If you develop a fever, worsening ear pain, or not improving in 4-5 days, you may fill and start taking the antibiotic- Augmentin.  If you start taking the antibiotic, please be sure to take the entire course to help prevent infection from coming back.

## 2016-06-13 NOTE — ED Provider Notes (Signed)
CSN: [id number]     Arrival date & time 06/13/16  [id number] History   First MD Initiated Contact with Patient 06/13/16 0848     Chief Complaint  Patient presents with  . Otalgia   (Consider location/radiation/quality/duration/timing/severity/associated sxs/prior Treatment) HPI Susan Figueroa is a 65 y.o. female presenting to UC with c/o Right ear pain and fullness after having URI symptoms with cough and congestion for about 3 weeks.  She did finish a course of azithromycin last week and has used a Neti pot but thinks that may have worsened her ear pressure.  She notes she is suppose to get on an airplane in about 4 days and hopes her ear pain improves by then.  Pain is aching and sore, 3/10 at this time.  Denies fever, chills, n/v/d.    Past Medical History:  Diagnosis Date  . Thyroid disease    Past Surgical History:  Procedure Laterality Date  . ABDOMINAL HYSTERECTOMY     Family History  Problem Relation Age of Onset  . Cancer Mother     cervical  . Hypertension Brother   . Stroke Maternal Grandmother   . Heart attack Maternal Grandfather    Social History  Substance Use Topics  . Smoking status: Former Research scientist (life sciences)  . Smokeless tobacco: Never Used  . Alcohol use No   OB History    No data available     Review of Systems  Constitutional: Negative for chills and fever.  HENT: Positive for congestion ( mild) and ear pain (Right). Negative for rhinorrhea, sinus pain, sinus pressure and sore throat.   Respiratory: Negative for cough, chest tightness, shortness of breath and wheezing.   Gastrointestinal: Negative for constipation and vomiting.  Neurological: Negative for dizziness, light-headedness and headaches.    Allergies  Patient has no known allergies.  Home Medications   Prior to Admission medications   Medication Sig Start Date End Date Taking? Authorizing Provider  amoxicillin-clavulanate (AUGMENTIN) 875-125 MG tablet Take 1 tablet by mouth 2 (two) times daily. One po  bid x 7 days 06/13/16   Noland Fordyce, PA-C  aspirin 81 MG tablet Take 81 mg by mouth daily.    Historical Provider, MD  Cholecalciferol (VITAMIN D3) 2000 UNITS TABS Take 2,000 Int'l Units by mouth daily.    Historical Provider, MD  hydrochlorothiazide (HYDRODIURIL) 25 MG tablet Take 25 mg by mouth daily.    Historical Provider, MD  levothyroxine (SYNTHROID, LEVOTHROID) 50 MCG tablet Take 1 tablet (50 mcg total) by mouth daily before breakfast. 04/27/16   [name], PA-C  Magnesium 500 MG CAPS Take by mouth daily.    Historical Provider, MD  nitrofurantoin, macrocrystal-monohydrate, (MACROBID) 100 MG capsule Take 1 capsule (100 mg total) by mouth 2 (two) times daily. 04/18/16   Jade L Breeback, PA-C  Omega-3 Fatty Acids (FISH OIL PO) Take 300 mg by mouth daily.    Historical Provider, MD  Potassium Chloride GRAN Take by mouth as needed.    Historical Provider, MD  predniSONE (DELTASONE) 20 MG tablet 3 tabs po day one, then 2 po daily x 4 days 06/13/16   Noland Fordyce, PA-C   Meds Ordered and Administered this Visit   Medications  dexamethasone (DECADRON) injection 10 mg (not administered)    BP 135/93 (BP Location: Left Arm)   Pulse 79   Temp 97.8 F (36.6 C) (Oral)   Ht 5\' 7"  (1.702 m)   Wt 172 lb (78 kg)   SpO2 98%   BMI  26.94 kg/m  No data found.   Physical Exam  Constitutional: She is oriented to person, place, and time. She appears well-developed and well-nourished. No distress.  HENT:  Head: Normocephalic and atraumatic.  Right Ear: A middle ear effusion is present.  Left Ear: A middle ear effusion is present.  Nose: Nose normal. Right sinus exhibits no maxillary sinus tenderness and no frontal sinus tenderness. Left sinus exhibits no maxillary sinus tenderness and no frontal sinus tenderness.  Mouth/Throat: Uvula is midline, oropharynx is clear and moist and mucous membranes are normal.  Eyes: EOM are normal.  Neck: Normal range of motion. Neck supple.   Cardiovascular: Normal rate and regular rhythm.   Pulmonary/Chest: Effort normal and breath sounds normal. No stridor. No respiratory distress. She has no wheezes. She has no rales.  Musculoskeletal: Normal range of motion.  Lymphadenopathy:    She has no cervical adenopathy.  Neurological: She is alert and oriented to person, place, and time.  Skin: Skin is warm and dry. She is not diaphoretic.  Psychiatric: She has a normal mood and affect. Her behavior is normal.  Nursing note and vitals reviewed.   Urgent Care Course   Clinical Course     Procedures (including critical care time)  Labs Review Labs Reviewed - No data to display  Imaging Review No results found.   MDM   1. Right ear pain   2. Upper respiratory tract infection, unspecified type    Pt c/o Right ear pain after 3 weeks of URI symptoms. She did complete a course of azithromycin last week and used Flonase.   Small amount of middle ear effusion noted in Left and Right ears.   Tx in UC: Decadron 10mg  IM  Rx: Prednisone. Prescription to hold for Augmentin. Pt to fill if she develops worsening pain, persistent fever, or not improving in 1 week. Home care instructions provided.    Noland Fordyce, PA-C 06/13/16 [phone]

## 2016-07-05 ENCOUNTER — Other Ambulatory Visit: Payer: Self-pay | Admitting: Physician Assistant

## 2016-11-03 ENCOUNTER — Other Ambulatory Visit: Payer: Self-pay | Admitting: *Deleted

## 2016-11-03 MED ORDER — LEVOTHYROXINE SODIUM 50 MCG PO TABS: 50.0000 ug | ORAL_TABLET | Freq: Every day | ORAL | 0 refills | Status: DC

## 2017-01-05 ENCOUNTER — Other Ambulatory Visit: Payer: Self-pay | Admitting: *Deleted

## 2017-01-05 MED ORDER — HYDROCHLOROTHIAZIDE 25 MG PO TABS: ORAL_TABLET | ORAL | 6 refills | Status: DC

## 2017-01-17 DIAGNOSIS — M25561 Pain in right knee: Secondary | ICD-10-CM | POA: Diagnosis not present

## 2017-01-17 DIAGNOSIS — M2241 Chondromalacia patellae, right knee: Secondary | ICD-10-CM | POA: Diagnosis not present

## 2017-01-17 DIAGNOSIS — M1711 Unilateral primary osteoarthritis, right knee: Secondary | ICD-10-CM | POA: Diagnosis not present

## 2017-01-30 ENCOUNTER — Other Ambulatory Visit: Payer: Self-pay | Admitting: Physician Assistant

## 2017-02-05 ENCOUNTER — Other Ambulatory Visit: Payer: Self-pay | Admitting: Physician Assistant

## 2017-02-06 ENCOUNTER — Other Ambulatory Visit: Payer: Self-pay | Admitting: *Deleted

## 2017-03-04 ENCOUNTER — Other Ambulatory Visit: Payer: Self-pay | Admitting: Physician Assistant

## 2017-03-24 ENCOUNTER — Other Ambulatory Visit: Payer: Self-pay | Admitting: Physician Assistant

## 2017-03-24 DIAGNOSIS — Z1231 Encounter for screening mammogram for malignant neoplasm of breast: Secondary | ICD-10-CM

## 2017-05-05 ENCOUNTER — Other Ambulatory Visit: Payer: Self-pay | Admitting: Physician Assistant

## 2017-05-10 ENCOUNTER — Ambulatory Visit (INDEPENDENT_AMBULATORY_CARE_PROVIDER_SITE_OTHER): Payer: BLUE CROSS/BLUE SHIELD

## 2017-05-10 DIAGNOSIS — Z1231 Encounter for screening mammogram for malignant neoplasm of breast: Secondary | ICD-10-CM

## 2017-05-12 DIAGNOSIS — Z23 Encounter for immunization: Secondary | ICD-10-CM | POA: Diagnosis not present

## 2017-05-30 DIAGNOSIS — Z0000 Encounter for general adult medical examination without abnormal findings: Secondary | ICD-10-CM | POA: Diagnosis not present

## 2017-05-31 LAB — BASIC METABOLIC PANEL
BUN: 14 (ref 4–21)
Creatinine: 1.2 — AB (ref 0.5–1.1)
GLUCOSE: 98
Potassium: 4 (ref 3.4–5.3)
SODIUM: 141 (ref 137–147)

## 2017-05-31 LAB — CBC AND DIFFERENTIAL
HCT: 40 (ref 36–46)
Hemoglobin: 13.3 (ref 12.0–16.0)
NEUTROS ABS: 4
PLATELETS: 354 (ref 150–399)
WBC: 7

## 2017-05-31 LAB — LIPID PANEL
Cholesterol: 287 — AB (ref 0–200)
HDL: 58 (ref 35–70)
LDL CALC: 200
Triglycerides: 146 (ref 40–160)

## 2017-05-31 LAB — HEPATIC FUNCTION PANEL
ALK PHOS: 98 (ref 25–125)
ALT: 19 (ref 7–35)
AST: 16 (ref 13–35)
BILIRUBIN DIRECT: 0.5 — AB (ref 0.01–0.4)
Bilirubin, Total: 0.5

## 2017-05-31 LAB — TSH: TSH: 3.65 (ref 0.41–5.90)

## 2017-06-05 ENCOUNTER — Ambulatory Visit: Payer: BLUE CROSS/BLUE SHIELD | Admitting: Physician Assistant

## 2017-06-07 ENCOUNTER — Emergency Department (HOSPITAL_COMMUNITY): Payer: BLUE CROSS/BLUE SHIELD | Admitting: Certified Registered"

## 2017-06-07 ENCOUNTER — Other Ambulatory Visit: Payer: Self-pay

## 2017-06-07 ENCOUNTER — Encounter (HOSPITAL_COMMUNITY): Payer: Self-pay

## 2017-06-07 ENCOUNTER — Ambulatory Visit: Payer: BLUE CROSS/BLUE SHIELD | Admitting: Physician Assistant

## 2017-06-07 ENCOUNTER — Observation Stay (HOSPITAL_COMMUNITY)
Admission: EM | Admit: 2017-06-07 | Discharge: 2017-06-09 | Disposition: A | Discharge status: Home / Self Care | Payer: BLUE CROSS/BLUE SHIELD | Attending: Surgery | Admitting: Surgery

## 2017-06-07 ENCOUNTER — Encounter: Payer: Self-pay | Admitting: Physician Assistant

## 2017-06-07 ENCOUNTER — Encounter (HOSPITAL_COMMUNITY)
Admission: EM | Disposition: A | Discharge status: Home / Self Care | Payer: Self-pay | Source: Home / Self Care | Attending: Emergency Medicine

## 2017-06-07 ENCOUNTER — Ambulatory Visit (HOSPITAL_BASED_OUTPATIENT_CLINIC_OR_DEPARTMENT_OTHER)
Admission: RE | Admit: 2017-06-07 | Discharge: 2017-06-07 | Disposition: A | Discharge status: Home / Self Care | Payer: BLUE CROSS/BLUE SHIELD | Source: Ambulatory Visit | Attending: Physician Assistant | Admitting: Physician Assistant

## 2017-06-07 VITALS — BP 135/89 | HR 90 | Temp 98.7°F | Ht 67.0 in | Wt 159.0 lb

## 2017-06-07 DIAGNOSIS — N183 Chronic kidney disease, stage 3 unspecified: Secondary | ICD-10-CM | POA: Diagnosis present

## 2017-06-07 DIAGNOSIS — R1911 Absent bowel sounds: Secondary | ICD-10-CM

## 2017-06-07 DIAGNOSIS — E7800 Pure hypercholesterolemia, unspecified: Secondary | ICD-10-CM | POA: Insufficient documentation

## 2017-06-07 DIAGNOSIS — Z7982 Long term (current) use of aspirin: Secondary | ICD-10-CM | POA: Diagnosis not present

## 2017-06-07 DIAGNOSIS — Z87891 Personal history of nicotine dependence: Secondary | ICD-10-CM | POA: Insufficient documentation

## 2017-06-07 DIAGNOSIS — K3580 Unspecified acute appendicitis: Secondary | ICD-10-CM | POA: Diagnosis not present

## 2017-06-07 DIAGNOSIS — G4730 Sleep apnea, unspecified: Secondary | ICD-10-CM | POA: Insufficient documentation

## 2017-06-07 DIAGNOSIS — E785 Hyperlipidemia, unspecified: Secondary | ICD-10-CM | POA: Diagnosis not present

## 2017-06-07 DIAGNOSIS — Z79899 Other long term (current) drug therapy: Secondary | ICD-10-CM | POA: Insufficient documentation

## 2017-06-07 DIAGNOSIS — R109 Unspecified abdominal pain: Secondary | ICD-10-CM | POA: Diagnosis present

## 2017-06-07 DIAGNOSIS — K3589 Other acute appendicitis without perforation or gangrene: Secondary | ICD-10-CM | POA: Diagnosis not present

## 2017-06-07 DIAGNOSIS — R198 Other specified symptoms and signs involving the digestive system and abdomen: Secondary | ICD-10-CM | POA: Diagnosis not present

## 2017-06-07 DIAGNOSIS — I7300 Raynaud's syndrome without gangrene: Secondary | ICD-10-CM | POA: Diagnosis not present

## 2017-06-07 DIAGNOSIS — Z9049 Acquired absence of other specified parts of digestive tract: Secondary | ICD-10-CM

## 2017-06-07 DIAGNOSIS — R1032 Left lower quadrant pain: Secondary | ICD-10-CM | POA: Insufficient documentation

## 2017-06-07 DIAGNOSIS — R1031 Right lower quadrant pain: Secondary | ICD-10-CM | POA: Diagnosis not present

## 2017-06-07 DIAGNOSIS — I700 Atherosclerosis of aorta: Secondary | ICD-10-CM | POA: Insufficient documentation

## 2017-06-07 DIAGNOSIS — K3533 Acute appendicitis with perforation and localized peritonitis, with abscess: Secondary | ICD-10-CM | POA: Diagnosis not present

## 2017-06-07 DIAGNOSIS — E039 Hypothyroidism, unspecified: Secondary | ICD-10-CM | POA: Diagnosis not present

## 2017-06-07 HISTORY — DX: Unspecified acute appendicitis: K35.80

## 2017-06-07 HISTORY — PX: LAPAROSCOPIC APPENDECTOMY: SHX408

## 2017-06-07 LAB — CBC WITH DIFFERENTIAL/PLATELET
BASOS ABS: 0 10*3/uL (ref 0.0–0.1)
Basophils Relative: 0 %
EOS PCT: 1 %
Eosinophils Absolute: 0.1 10*3/uL (ref 0.0–0.7)
HCT: 35 % — ABNORMAL LOW (ref 36.0–46.0)
Hemoglobin: 11.8 g/dL — ABNORMAL LOW (ref 12.0–15.0)
LYMPHS PCT: 29 %
Lymphs Abs: 2.9 10*3/uL (ref 0.7–4.0)
MCH: 29.6 pg (ref 26.0–34.0)
MCHC: 33.7 g/dL (ref 30.0–36.0)
MCV: 87.7 fL (ref 78.0–100.0)
Monocytes Absolute: 0.9 10*3/uL (ref 0.1–1.0)
Monocytes Relative: 9 %
NEUTROS PCT: 61 %
Neutro Abs: 6.3 10*3/uL (ref 1.7–7.7)
PLATELETS: 287 10*3/uL (ref 150–400)
RBC: 3.99 MIL/uL (ref 3.87–5.11)
RDW: 13 % (ref 11.5–15.5)
WBC: 10.1 10*3/uL (ref 4.0–10.5)

## 2017-06-07 LAB — COMPREHENSIVE METABOLIC PANEL
ALT: 13 U/L — ABNORMAL LOW (ref 14–54)
ANION GAP: 10 (ref 5–15)
AST: 19 U/L (ref 15–41)
Albumin: 3.7 g/dL (ref 3.5–5.0)
Alkaline Phosphatase: 86 U/L (ref 38–126)
BILIRUBIN TOTAL: 0.5 mg/dL (ref 0.3–1.2)
BUN: 16 mg/dL (ref 6–20)
CO2: 25 mmol/L (ref 22–32)
Calcium: 9.1 mg/dL (ref 8.9–10.3)
Chloride: 102 mmol/L (ref 101–111)
Creatinine, Ser: 1.25 mg/dL — ABNORMAL HIGH (ref 0.44–1.00)
GFR, EST AFRICAN AMERICAN: 52 mL/min — AB (ref >60)
GFR, EST NON AFRICAN AMERICAN: 44 mL/min — AB (ref >60)
Glucose, Bld: 137 mg/dL — ABNORMAL HIGH (ref 65–99)
POTASSIUM: 3 mmol/L — AB (ref 3.5–5.1)
Sodium: 137 mmol/L (ref 135–145)
TOTAL PROTEIN: 7.1 g/dL (ref 6.5–8.1)

## 2017-06-07 LAB — PROTIME-INR
INR: 1.01
PROTHROMBIN TIME: 13.2 s (ref 11.4–15.2)

## 2017-06-07 LAB — TYPE AND SCREEN
ABO/RH(D): O POS
ANTIBODY SCREEN: NEGATIVE

## 2017-06-07 SURGERY — APPENDECTOMY, LAPAROSCOPIC
Anesthesia: General

## 2017-06-07 MED ORDER — ATORVASTATIN CALCIUM 40 MG PO TABS: 40.0000 mg | ORAL_TABLET | Freq: Every day | ORAL | 1 refills | Status: DC

## 2017-06-07 MED ORDER — FENTANYL CITRATE (PF) 250 MCG/5ML IJ SOLN
INTRAMUSCULAR | Status: DC | PRN
  Administered 2017-06-07: 50 ug via INTRAVENOUS
  Administered 2017-06-07: 100 ug via INTRAVENOUS
  Administered 2017-06-08 (×2): 50 ug via INTRAVENOUS

## 2017-06-07 MED ORDER — DEXAMETHASONE SODIUM PHOSPHATE 10 MG/ML IJ SOLN
INTRAMUSCULAR | Status: DC | PRN
  Administered 2017-06-07: 10 mg via INTRAVENOUS

## 2017-06-07 MED ORDER — PROPOFOL 10 MG/ML IV BOLUS
INTRAVENOUS | Status: AC
  Filled 2017-06-07: qty 20

## 2017-06-07 MED ORDER — MIDAZOLAM HCL 2 MG/2ML IJ SOLN
INTRAMUSCULAR | Status: DC | PRN
  Administered 2017-06-07: 1 mg via INTRAVENOUS

## 2017-06-07 MED ORDER — FENTANYL CITRATE (PF) 250 MCG/5ML IJ SOLN
INTRAMUSCULAR | Status: AC
  Filled 2017-06-07: qty 5

## 2017-06-07 MED ORDER — ROCURONIUM BROMIDE 10 MG/ML (PF) SYRINGE
PREFILLED_SYRINGE | INTRAVENOUS | Status: DC | PRN
  Administered 2017-06-07: 30 mg via INTRAVENOUS

## 2017-06-07 MED ORDER — PROPOFOL 10 MG/ML IV BOLUS
INTRAVENOUS | Status: DC | PRN
  Administered 2017-06-07: 140 mg via INTRAVENOUS

## 2017-06-07 MED ORDER — 0.9 % SODIUM CHLORIDE (POUR BTL) OPTIME
TOPICAL | Status: DC | PRN
  Administered 2017-06-07: 1000 mL

## 2017-06-07 MED ORDER — PIPERACILLIN-TAZOBACTAM 3.375 G IVPB 30 MIN
3.3750 g | Freq: Once | INTRAVENOUS | Status: AC
  Administered 2017-06-07: 3.375 g via INTRAVENOUS
  Filled 2017-06-07: qty 50

## 2017-06-07 MED ORDER — MIDAZOLAM HCL 2 MG/2ML IJ SOLN
INTRAMUSCULAR | Status: AC
  Filled 2017-06-07: qty 2

## 2017-06-07 MED ORDER — IOPAMIDOL (ISOVUE-300) INJECTION 61%
100.0000 mL | Freq: Once | INTRAVENOUS | Status: AC | PRN
  Administered 2017-06-07: 100 mL via INTRAVENOUS

## 2017-06-07 MED ORDER — LACTATED RINGERS IV SOLN
INTRAVENOUS | Status: DC | PRN
  Administered 2017-06-07 (×2): via INTRAVENOUS

## 2017-06-07 MED ORDER — SUCCINYLCHOLINE CHLORIDE 200 MG/10ML IV SOSY
PREFILLED_SYRINGE | INTRAVENOUS | Status: DC | PRN
  Administered 2017-06-07: 100 mg via INTRAVENOUS

## 2017-06-07 MED ORDER — BUPIVACAINE LIPOSOME 1.3 % IJ SUSP
20.0000 mL | Freq: Once | INTRAMUSCULAR | Status: AC
  Administered 2017-06-08: 20 mL
  Filled 2017-06-07: qty 20

## 2017-06-07 MED ORDER — LIDOCAINE 2% (20 MG/ML) 5 ML SYRINGE
INTRAMUSCULAR | Status: DC | PRN
  Administered 2017-06-07: 60 mg via INTRAVENOUS

## 2017-06-07 SURGICAL SUPPLY — 43 items
ADH SKN CLS APL DERMABOND .7 (GAUZE/BANDAGES/DRESSINGS) ×1
APPLIER CLIP ROT 10 11.4 M/L (STAPLE)
APR CLP MED LRG 11.4X10 (STAPLE)
BAG SPEC RTRVL 10 TROC 200 (ENDOMECHANICALS)
BAG SPEC RTRVL LRG 6X4 10 (ENDOMECHANICALS) ×1
CABLE HIGH FREQUENCY MONO STRZ (ELECTRODE) ×1 IMPLANT
CHLORAPREP W/TINT 26ML (MISCELLANEOUS) ×2 IMPLANT
CLIP APPLIE ROT 10 11.4 M/L (STAPLE) IMPLANT
COVER SURGICAL LIGHT HANDLE (MISCELLANEOUS) ×2 IMPLANT
CUTTER FLEX LINEAR 45M (STAPLE) ×2 IMPLANT
DECANTER SPIKE VIAL GLASS SM (MISCELLANEOUS) IMPLANT
DERMABOND ADVANCED (GAUZE/BANDAGES/DRESSINGS) ×1
DERMABOND ADVANCED .7 DNX12 (GAUZE/BANDAGES/DRESSINGS) ×1 IMPLANT
DRAPE LAPAROSCOPIC ABDOMINAL (DRAPES) IMPLANT
ELECT REM PT RETURN 15FT ADLT (MISCELLANEOUS) ×2 IMPLANT
ENDOLOOP SUT PDS II  0 18 (SUTURE)
ENDOLOOP SUT PDS II 0 18 (SUTURE) IMPLANT
GLOVE BIOGEL M 8.0 STRL (GLOVE) ×2 IMPLANT
GOWN STRL REUS W/TWL XL LVL3 (GOWN DISPOSABLE) ×2 IMPLANT
KIT BASIN OR (CUSTOM PROCEDURE TRAY) ×2 IMPLANT
PAD POSITIONING PINK XL (MISCELLANEOUS) ×2 IMPLANT
POUCH RETRIEVAL ECOSAC 10 (ENDOMECHANICALS) IMPLANT
POUCH RETRIEVAL ECOSAC 10MM (ENDOMECHANICALS)
POUCH SPECIMEN RETRIEVAL 10MM (ENDOMECHANICALS) ×2 IMPLANT
RELOAD 45 VASCULAR/THIN (ENDOMECHANICALS) ×2 IMPLANT
RELOAD STAPLE 45 2.5 WHT GRN (ENDOMECHANICALS) IMPLANT
RELOAD STAPLE 45 3.5 BLU ETS (ENDOMECHANICALS) IMPLANT
RELOAD STAPLE TA45 3.5 REG BLU (ENDOMECHANICALS) IMPLANT
SCISSORS LAP 5X45 EPIX DISP (ENDOMECHANICALS) ×2 IMPLANT
SET IRRIG TUBING LAPAROSCOPIC (IRRIGATION / IRRIGATOR) ×2 IMPLANT
SHEARS HARMONIC ACE PLUS 45CM (MISCELLANEOUS) ×2 IMPLANT
SLEEVE XCEL OPT CAN 5 100 (ENDOMECHANICALS) ×2 IMPLANT
STAPLER VISISTAT 35W (STAPLE) IMPLANT
SUT MNCRL AB 4-0 PS2 18 (SUTURE) ×2 IMPLANT
SUT VICRYL 0 UR6 27IN ABS (SUTURE) ×1 IMPLANT
TOWEL OR 17X26 10 PK STRL BLUE (TOWEL DISPOSABLE) ×2 IMPLANT
TRAY FOLEY W/METER SILVER 14FR (SET/KITS/TRAYS/PACK) IMPLANT
TRAY FOLEY W/METER SILVER 16FR (SET/KITS/TRAYS/PACK) ×1 IMPLANT
TRAY LAPAROSCOPIC (CUSTOM PROCEDURE TRAY) ×2 IMPLANT
TROCAR BLADELESS OPT 5 100 (ENDOMECHANICALS) ×2 IMPLANT
TROCAR XCEL BLUNT TIP 100MML (ENDOMECHANICALS) ×2 IMPLANT
TROCAR XCEL NON-BLD 11X100MML (ENDOMECHANICALS) IMPLANT
TUBING INSUF HEATED (TUBING) ×2 IMPLANT

## 2017-06-07 NOTE — Anesthesia Preprocedure Evaluation (Addendum)
Anesthesia Evaluation  Patient identified by MRN, date of birth, ID band Patient awake    Reviewed: Allergy & Precautions, NPO status , Patient's Chart, lab work & pertinent test results  Airway Mallampati: II  TM Distance: >3 FB Neck ROM: Full    Dental no notable dental hx. (+) Dental Advisory Given, Chipped   Pulmonary neg pulmonary ROS, former smoker,    Pulmonary exam normal breath sounds clear to auscultation       Cardiovascular negative cardio ROS Normal cardiovascular exam Rhythm:Regular Rate:Normal     Neuro/Psych negative neurological ROS  negative psych ROS   GI/Hepatic negative GI ROS, Neg liver ROS,   Endo/Other  Hypothyroidism   Renal/GU Renal InsufficiencyRenal disease  negative genitourinary   Musculoskeletal negative musculoskeletal ROS (+)   Abdominal   Peds negative pediatric ROS (+)  Hematology negative hematology ROS (+)   Anesthesia Other Findings   Reproductive/Obstetrics negative OB ROS                           Anesthesia Physical Anesthesia Plan  ASA: II  Anesthesia Plan: General   Post-op Pain Management:    Induction: Intravenous, Rapid sequence and Cricoid pressure planned  PONV Risk Score and Plan: 1 and 2 and Ondansetron, Dexamethasone and Treatment may vary due to age or medical condition  Airway Management Planned: Oral ETT  Additional Equipment:   Intra-op Plan:   Post-operative Plan: Extubation in OR  Informed Consent: I have reviewed the patients History and Physical, chart, labs and discussed the procedure including the risks, benefits and alternatives for the proposed anesthesia with the patient or authorized representative who has indicated his/her understanding and acceptance.   Dental advisory given  Plan Discussed with: CRNA and Surgeon  Anesthesia Plan Comments:        Anesthesia Quick Evaluation

## 2017-06-07 NOTE — ED Triage Notes (Signed)
Pt sent from [place] for appendicitis. A&Ox4. Ambulatory. R sided abd pain 5/10

## 2017-06-07 NOTE — Progress Notes (Signed)
Subjective:    Patient ID: Susan Figueroa, female    DOB: Sep 29, 1950, 66 y.o.   MRN: [id number]  HPI  Pt is a 66 yo female who presents to the clinic c/o abdominal pain that started suddenly yesterday morning. She has no hx of diverticulitis. Pain started more in upper abdomen but today is more in right lower quadrant. She denies fever, chills, SOB. She had one bowel movement yesterday that was pale in color and like a "pebble". She would rate the pain as persisent and 8/10. Last night she got some relief with a bilious vomit. She has had a few sips of water and 2 crackers today. She has had no bowel movement today. All movement causes pain. Food does not exacerbate it to her knowledge.   She does bring in labs where her LDL is 200. She is not on a statin.   .. Active Ambulatory Problems    Diagnosis Date Noted  . Thyroid activity decreased 04/08/2015  . Lower leg edema 04/08/2015  . CKD (chronic kidney disease) stage 3, GFR 30-59 ml/min (HCC) 04/10/2015  . Hyperlipidemia 04/10/2015  . Raynaud's disease without gangrene 05/04/2015  . No energy 05/04/2015  . Snoring 05/04/2015  . Sleep apnea 06/30/2015  . Dry mouth 10/20/2015  . Weight gain 10/20/2015  . Dry skin 10/20/2015  . Urinary incontinence in female 12/25/2015  . Chronic cystitis 04/15/2016  . Abdominal discomfort 04/15/2016  . Dysuria 04/15/2016  . Leukocytes in urine 04/15/2016  . Right lower quadrant abdominal pain 06/07/2017  . Absent bowel sounds 06/07/2017  . Left lower quadrant pain 06/07/2017  . Pure hypercholesterolemia 06/07/2017   Resolved Ambulatory Problems    Diagnosis Date Noted  . No Resolved Ambulatory Problems   Past Medical History:  Diagnosis Date  . Thyroid disease     Review of Systems See HPI.     Objective:   Physical Exam  Constitutional: She is oriented to person, place, and time. She appears well-developed and well-nourished.  HENT:  Head: Normocephalic and atraumatic.   Cardiovascular: Normal rate, regular rhythm and normal heart sounds.  Pulmonary/Chest: Effort normal and breath sounds normal. She has no wheezes.  Abdominal:  I was not able to hear any bowel sounds.  Severe tenderness in the RLQ with guarding.  Moderate tenderness in the LLQ with guarding.  No tenderness in upper abdomen.  Negative murphys sign.   Neurological: She is alert and oriented to person, place, and time.  Psychiatric: She has a normal mood and affect. Her behavior is normal.          Assessment & Plan:  Marland KitchenMarland KitchenGraylee was seen today for abdominal pain and nausea.  Diagnoses and all orders for this visit:  Right lower quadrant abdominal pain -     CT Abdomen Pelvis W Contrast; Future -     CBC with Differential/Platelet  Absent bowel sounds -     CT Abdomen Pelvis W Contrast; Future -     CBC with Differential/Platelet  Abdominal guarding -     CT Abdomen Pelvis W Contrast; Future -     CBC with Differential/Platelet  Left lower quadrant pain -     CT Abdomen Pelvis W Contrast; Future  Pure hypercholesterolemia -     atorvastatin (LIPITOR) 40 MG tablet; Take 1 tablet (40 mg total) daily by mouth.   STAT CT ordered to rule out appendicitis vs diverticulitis vs bowel obstruction.  CBC order.  Pt has recent CMP with renal  function.  Will call patient with results of CT.   LDL was 200. Start lipitor.discussed side effects. Recheck LDL in 6 months.

## 2017-06-07 NOTE — ED Provider Notes (Signed)
Emergency Department Provider Note   I have reviewed the triage vital signs and the nursing notes.   HISTORY  Chief Complaint Abdominal Pain   HPI Susan Figueroa is a 66 y.o. female with PMH of HLD and CKD presents to the ED with CT scan today showing acute appendicitis.  The patient began having epigastric discomfort last night.  She had some pain with sleeping but that seemed to resolve.  She also had some associated nausea.  Today she began having more bilateral lower abdominal pain and had a routine PCP follow up appointment scheduled. The patient was sent by her PCP for outpatient CT. She last ate shortly before imaging at 5 PM. No fever or chills. She was called at home with the results of the CT showing acute appendicitis. Denies fever or chills. Pain worse with movement or pressing in the lower abdomen. No radiation.   Past Medical History:  Diagnosis Date  . Acute appendicitis   . Thyroid disease     Patient Active Problem List   Diagnosis Date Noted  . Acute appendicitis with suppurative peritonitis s/p lap appendectomy 06/08/2017 06/08/2017  . Right lower quadrant abdominal pain 06/07/2017  . Absent bowel sounds 06/07/2017  . Left lower quadrant pain 06/07/2017  . Pure hypercholesterolemia 06/07/2017  . Abdominal guarding 06/07/2017  . Chronic cystitis 04/15/2016  . Abdominal discomfort 04/15/2016  . Dysuria 04/15/2016  . Leukocytes in urine 04/15/2016  . Urinary incontinence in female 12/25/2015  . Dry mouth 10/20/2015  . Weight gain 10/20/2015  . Dry skin 10/20/2015  . Sleep apnea 06/30/2015  . Raynaud's disease without gangrene 05/04/2015  . No energy 05/04/2015  . Snoring 05/04/2015  . CKD (chronic kidney disease) stage 3, GFR 30-59 ml/min (HCC) 04/10/2015  . Hyperlipidemia 04/10/2015  . Thyroid activity decreased 04/08/2015  . Lower leg edema 04/08/2015    Past Surgical History:  Procedure Laterality Date  . ABDOMINAL HYSTERECTOMY      Current  Outpatient Rx  . Order #: [id number] Class: Print  . Order #: [id number] Class: Print    Allergies Patient has no known allergies.  Family History  Problem Relation Age of Onset  . Cancer Mother        cervical  . Hypertension Brother   . Stroke Maternal Grandmother   . Heart attack Maternal Grandfather     Social History Social History   Tobacco Use  . Smoking status: Former Research scientist (life sciences)  . Smokeless tobacco: Never Used  Substance Use Topics  . Alcohol use: No    Alcohol/week: 0.0 oz  . Drug use: No    Review of Systems  Constitutional: No fever/chills Eyes: No visual changes. ENT: No sore throat. Cardiovascular: Denies chest pain. Respiratory: Denies shortness of breath. Gastrointestinal: Positive abdominal pain. Positive nausea, no vomiting.  No diarrhea.  No constipation. Genitourinary: Negative for dysuria. Musculoskeletal: Negative for back pain. Skin: Negative for rash. Neurological: Negative for headaches, focal weakness or numbness.  10-point ROS otherwise negative.  ____________________________________________   PHYSICAL EXAM:  VITAL SIGNS: ED Triage Vitals [06/07/17 2131]  Enc Vitals Group     BP (!) 138/92     Pulse Rate 73     Resp 16     Temp 98.4 F (36.9 C)     Temp Source Oral     SpO2 96 %     Pain Score 5    Constitutional: Alert and oriented. Well appearing and in no acute distress. Eyes: Conjunctivae are normal.  Head:  Atraumatic. Nose: No congestion/rhinnorhea. Mouth/Throat: Mucous membranes are moist.   Neck: No stridor.   Cardiovascular: Normal rate, regular rhythm. Good peripheral circulation. Grossly normal heart sounds.   Respiratory: Normal respiratory effort.  No retractions. Lungs CTAB. Gastrointestinal: Soft with focal RLQ tenderness to palpation with voluntary guarding. No distention.  Musculoskeletal: No lower extremity tenderness nor edema. No gross deformities of extremities. Neurologic:  Normal speech and language. No  gross focal neurologic deficits are appreciated.  Skin:  Skin is warm, dry and intact. No rash noted.  ____________________________________________   LABS (all labs ordered are listed, but only abnormal results are displayed)  Labs Reviewed  COMPREHENSIVE METABOLIC PANEL - Abnormal; Notable for the following components:      Result Value   Potassium 3.0 (*)    Glucose, Bld 137 (*)    Creatinine, Ser 1.25 (*)    ALT 13 (*)    GFR calc non Af Amer 44 (*)    GFR calc Af Amer 52 (*)    All other components within normal limits  CBC WITH DIFFERENTIAL/PLATELET - Abnormal; Notable for the following components:   Hemoglobin 11.8 (*)    HCT 35.0 (*)    All other components within normal limits  BASIC METABOLIC PANEL - Abnormal; Notable for the following components:   Glucose, Bld 238 (*)    Creatinine, Ser 1.22 (*)    Calcium 8.7 (*)    GFR calc non Af Amer 46 (*)    GFR calc Af Amer 53 (*)    All other components within normal limits  PROTIME-INR  TYPE AND SCREEN  ABO/RH  SURGICAL PATHOLOGY   ____________________________________________  RADIOLOGY  Ct Abdomen Pelvis W Contrast  Result Date: 06/07/2017 CLINICAL DATA:  Right lower, left lower and epigastric abdominal pain and nausea. Absent bowel sounds. Abdominal guarding. EXAM: CT ABDOMEN AND PELVIS WITH CONTRAST TECHNIQUE: Multidetector CT imaging of the abdomen and pelvis was performed using the standard protocol following bolus administration of intravenous contrast. CONTRAST:  177mL ISOVUE-300 IOPAMIDOL (ISOVUE-300) INJECTION 61% COMPARISON:  06/10/2013 renal sonogram. FINDINGS: Lower chest: No significant pulmonary nodules or acute consolidative airspace disease. Hepatobiliary: Normal liver size. Subcentimeter hypodense posterior right liver lobe lesion, too small to characterize, for which no follow-up is required unless the patient has risk factors for liver malignancy. No additional liver lesions. Normal gallbladder with no  radiopaque cholelithiasis. No biliary ductal dilatation. Pancreas: Normal, with no mass or duct dilation. Spleen: Normal size. No mass. Adrenals/Urinary Tract: Normal adrenals. Normal kidneys with no hydronephrosis and no renal mass. Normal bladder. Stomach/Bowel: Small hiatal hernia. Otherwise normal nondistended stomach. Normal caliber small bowel with no small bowel wall thickening. Appendix is diffusely dilated and thick walled with mild periappendiceal fat stranding, compatible with acute appendicitis. Appendix: Location: Right lower quadrant. Diameter: 13 mm Appendicolith: Not present. Mucosal hyper-enhancement: Present. Extraluminal gas: Not present. Periappendiceal collection: Not present. Mild presumably reactive wall thickening at the base of the cecum. No additional sites of large bowel wall thickening or pericolonic fat stranding. Oral contrast progresses to the left colon. No significant diverticulosis. Vascular/Lymphatic: Atherosclerotic nonaneurysmal abdominal aorta. Patent portal, splenic, hepatic and renal veins. No pathologically enlarged lymph nodes in the abdomen or pelvis. Reproductive: Status post hysterectomy, with no abnormal findings at the vaginal cuff. No adnexal mass. Other: No pneumoperitoneum, ascites or focal fluid collection. Musculoskeletal: No aggressive appearing focal osseous lesions. Minimal thoracolumbar spondylosis. IMPRESSION: 1. Acute appendicitis.  No abscess.  No free air. 2. Mild wall thickening at  the base of the cecum, presumably reactive. 3. Chronic findings include: Aortic Atherosclerosis (ICD10-I70.0). Small hiatal hernia. These results were called by telephone at the time of interpretation on 06/07/2017 at 7:52 pm to [place] , who verbally acknowledged these results. Electronically Signed   By: Ilona Sorrel M.D.   On: 06/07/2017 19:54    ____________________________________________   PROCEDURES  Procedure(s) performed:    Procedures  None ____________________________________________   INITIAL IMPRESSION / ASSESSMENT AND PLAN / ED COURSE  Pertinent labs & imaging results that were available during my care of the patient were reviewed by me and considered in my medical decision making (see chart for details).  Patient presents to the ED from home after an outpatient CT revealed acute appendicitis. No fever or chills. Plan for pre-op labs, NPO, and discussion with general surgery on call.   10:17 PM Spoke with Dr. Hassell Done regarding the CT scan. Labs and pre-op lab testing is pending.   Discussed patient's case with General Surgery, Dr. Hassell Done to request admission. Patient and family (if present) updated with plan. Care transferred to General Surgery service.  I reviewed all nursing notes, vitals, pertinent old records, EKGs, labs, imaging (as available).  ____________________________________________  FINAL CLINICAL IMPRESSION(S) / ED DIAGNOSES  Final diagnoses:  Other acute appendicitis     MEDICATIONS GIVEN DURING THIS VISIT:  Medications  heparin injection 5,000 Units (5,000 Units Subcutaneous Given 06/08/17 1245)  dextrose 5 % and 0.45 % NaCl with KCl 20 mEq/L infusion ( Intravenous New Bag/Given 06/08/17 1247)  gabapentin (NEURONTIN) capsule 300 mg (300 mg Oral Given 06/08/17 1102)  celecoxib (CELEBREX) capsule 200 mg (200 mg Oral Given 06/08/17 1103)  HYDROcodone-acetaminophen (NORCO/VICODIN) 5-325 MG per tablet 1-2 tablet (not administered)  pantoprazole (PROTONIX) injection 40 mg (not administered)  hydrALAZINE (APRESOLINE) injection 10 mg (not administered)  ondansetron (ZOFRAN-ODT) disintegrating tablet 4 mg ( Oral See Alternative 06/08/17 0939)    Or  ondansetron (ZOFRAN) injection 4 mg (4 mg Intravenous Given 06/08/17 0939)  ondansetron (ZOFRAN) 4 MG/2ML injection (not administered)  phenol (CHLORASEPTIC) mouth spray 1 spray (not administered)  morphine 4 MG/ML injection 2 mg (not  administered)  piperacillin-tazobactam (ZOSYN) IVPB 3.375 g (3.375 g Intravenous New Bag/Given 06/08/17 1245)  piperacillin-tazobactam (ZOSYN) IVPB 3.375 g (0 g Intravenous Stopped 06/07/17 2317)  bupivacaine liposome (EXPAREL) 1.3 % injection 266 mg (20 mLs Infiltration Given 06/08/17 0046)  lactated ringers infusion (  Stopped 06/08/17 0316)    Note:  This document was prepared using Dragon voice recognition software and may include unintentional dictation errors.  Nanda Quinton, MD Emergency Medicine    Long, [facility], MD 06/08/17 1320

## 2017-06-07 NOTE — ED Notes (Signed)
ED Provider at bedside. 

## 2017-06-07 NOTE — Anesthesia Procedure Notes (Signed)
Procedure Name: Intubation Date/Time: 06/07/2017 11:35 PM Performed by: Cynda Familia, CRNA Pre-anesthesia Checklist: Patient identified, Emergency Drugs available, Suction available and Patient being monitored Patient Re-evaluated:Patient Re-evaluated prior to induction Oxygen Delivery Method: Circle System Utilized Preoxygenation: Pre-oxygenation with 100% oxygen Induction Type: IV induction, Rapid sequence and Cricoid Pressure applied Ventilation: Mask ventilation without difficulty Laryngoscope Size: Miller and 2 Grade View: Grade I Tube type: Oral Number of attempts: 1 Airway Equipment and Method: Stylet Placement Confirmation: ETT inserted through vocal cords under direct vision,  positive ETCO2 and breath sounds checked- equal and bilateral Secured at: 22 cm Tube secured with: Tape Dental Injury: Teeth and Oropharynx as per pre-operative assessment  Comments: Smooth IV induction Rose--- Rose Cricoid pressure--- AM CRNA intubation atraumatic--- teeth and mouth as preop--- bilat BS Rose

## 2017-06-07 NOTE — ED Notes (Signed)
Bed: UO37 Expected date:  Expected time:  Means of arrival:  Comments: Susan Figueroa

## 2017-06-07 NOTE — ED Notes (Signed)
OR here to pick up pt.

## 2017-06-07 NOTE — H&P (Signed)
Chief Complaint: Right lower quadrant abdominal pain  History of Present Illness:  Susan Figueroa is an 66 y.o. female who began having abdominal pain yesterday which began in the mid epigastrium getting better last night and then localized to the right lower quadrant.  She already had an appointment today with her primary care physician and was seen and referred for a CT scan.  This was performed out admits that her [facility] and demonstrated appendiceal thickening consistent with acute appendicitis and she was referred to the [facility].  She was seen by me in the ED and I explained the procedure in some detail including laparoscopic and open appendectomy.  Proceed tonight with the procedure.  Past Medical History:  Diagnosis Date  . Acute appendicitis   . Thyroid disease     Past Surgical History:  Procedure Laterality Date  . ABDOMINAL HYSTERECTOMY      Current Facility-Administered Medications  Medication Dose Route Frequency Provider Last Rate Last Dose  . piperacillin-tazobactam (ZOSYN) IVPB 3.375 g  3.375 g Intravenous Once Margette Fast, MD 100 mL/hr at 06/07/17 2247 3.375 g at 06/07/17 2247   Current Outpatient Medications  Medication Sig Dispense Refill  . aspirin 81 MG tablet Take 81 mg by mouth daily.    . Cholecalciferol (VITAMIN D3) 2000 UNITS TABS Take 2,000 Int'l Units by mouth daily.    . hydrochlorothiazide (HYDRODIURIL) 25 MG tablet TAKE 1 TABLET BY MOUTH EVERY DAY ONCE A DAY ORALLY 30 DAYS 30 tablet 6  . levothyroxine (SYNTHROID, LEVOTHROID) 50 MCG tablet TAKE 1 TABLET (50 MCG TOTAL) BY MOUTH DAILY BEFORE BREAKFAST. MUST HAVE LAB WORK FOR FUTURE REFILLS. 90 tablet 0  . Magnesium 500 MG CAPS Take 1 capsule daily by mouth.     . Potassium Chloride GRAN Take 1 capsule daily by mouth.     [name] atorvastatin (LIPITOR) 40 MG tablet Take 1 tablet (40 mg total) daily by mouth. 90 tablet 1   Patient has no known allergies. Family History  Problem Relation Age of Onset  .  Cancer Mother        cervical  . Hypertension Brother   . Stroke Maternal Grandmother   . Heart attack Maternal Grandfather    Social History:   reports that she has quit smoking. she has never used smokeless tobacco. She reports that she does not drink alcohol or use drugs.   REVIEW OF SYSTEMS : Negative except for items listed in the problem list  Physical Exam:   Blood pressure (!) 138/92, pulse 73, temperature 98.4 F (36.9 C), temperature source Oral, resp. rate 16, SpO2 96 %. There is no height or weight on file to calculate BMI.  Gen:  WDWN white female NAD  Neurological: Alert and oriented to person, place, and time. Motor and sensory function is grossly intact  Head: Normocephalic and atraumatic.  Eyes: Conjunctivae are normal. Pupils are equal, round, and reactive to light. No scleral icterus.  Neck: Normal range of motion. Neck supple. No tracheal deviation or thyromegaly present.  Cardiovascular:  SR without murmurs or gallops.  No carotid bruits Breast: Not examined Respiratory: Effort normal.  No respiratory distress. No chest wall tenderness. Breath sounds normal.  No wheezes, rales or rhonchi.  Abdomen: Tender in the right lower quadrant GU: Not examined Musculoskeletal: Normal range of motion. Extremities are nontender. No cyanosis, edema or clubbing noted Lymphadenopathy: No cervical, preauricular, postauricular or axillary adenopathy is present Skin: Skin is warm and dry. No rash  noted. No diaphoresis. No erythema. No pallor. Pscyh: Normal mood and affect. Behavior is normal. Judgment and thought content normal.   LABORATORY RESULTS: No results found for this or any previous visit (from the past 48 hour(s)).   RADIOLOGY RESULTS: Ct Abdomen Pelvis W Contrast  Result Date: 06/07/2017 CLINICAL DATA:  Right lower, left lower and epigastric abdominal pain and nausea. Absent bowel sounds. Abdominal guarding. EXAM: CT ABDOMEN AND PELVIS WITH CONTRAST TECHNIQUE:  Multidetector CT imaging of the abdomen and pelvis was performed using the standard protocol following bolus administration of intravenous contrast. CONTRAST:  151mL ISOVUE-300 IOPAMIDOL (ISOVUE-300) INJECTION 61% COMPARISON:  06/10/2013 renal sonogram. FINDINGS: Lower chest: No significant pulmonary nodules or acute consolidative airspace disease. Hepatobiliary: Normal liver size. Subcentimeter hypodense posterior right liver lobe lesion, too small to characterize, for which no follow-up is required unless the patient has risk factors for liver malignancy. No additional liver lesions. Normal gallbladder with no radiopaque cholelithiasis. No biliary ductal dilatation. Pancreas: Normal, with no mass or duct dilation. Spleen: Normal size. No mass. Adrenals/Urinary Tract: Normal adrenals. Normal kidneys with no hydronephrosis and no renal mass. Normal bladder. Stomach/Bowel: Small hiatal hernia. Otherwise normal nondistended stomach. Normal caliber small bowel with no small bowel wall thickening. Appendix is diffusely dilated and thick walled with mild periappendiceal fat stranding, compatible with acute appendicitis. Appendix: Location: Right lower quadrant. Diameter: 13 mm Appendicolith: Not present. Mucosal hyper-enhancement: Present. Extraluminal gas: Not present. Periappendiceal collection: Not present. Mild presumably reactive wall thickening at the base of the cecum. No additional sites of large bowel wall thickening or pericolonic fat stranding. Oral contrast progresses to the left colon. No significant diverticulosis. Vascular/Lymphatic: Atherosclerotic nonaneurysmal abdominal aorta. Patent portal, splenic, hepatic and renal veins. No pathologically enlarged lymph nodes in the abdomen or pelvis. Reproductive: Status post hysterectomy, with no abnormal findings at the vaginal cuff. No adnexal mass. Other: No pneumoperitoneum, ascites or focal fluid collection. Musculoskeletal: No aggressive appearing focal  osseous lesions. Minimal thoracolumbar spondylosis. IMPRESSION: 1. Acute appendicitis.  No abscess.  No free air. 2. Mild wall thickening at the base of the cecum, presumably reactive. 3. Chronic findings include: Aortic Atherosclerosis (ICD10-I70.0). Small hiatal hernia. These results were called by telephone at the time of interpretation on 06/07/2017 at 7:52 pm to [place] , who verbally acknowledged these results. Electronically Signed   By: Ilona Sorrel M.D.   On: 06/07/2017 19:54    Problem List: Patient Active Problem List   Diagnosis Date Noted  . Right lower quadrant abdominal pain 06/07/2017  . Absent bowel sounds 06/07/2017  . Left lower quadrant pain 06/07/2017  . Pure hypercholesterolemia 06/07/2017  . Abdominal guarding 06/07/2017  . Chronic cystitis 04/15/2016  . Abdominal discomfort 04/15/2016  . Dysuria 04/15/2016  . Leukocytes in urine 04/15/2016  . Urinary incontinence in female 12/25/2015  . Dry mouth 10/20/2015  . Weight gain 10/20/2015  . Dry skin 10/20/2015  . Sleep apnea 06/30/2015  . Raynaud's disease without gangrene 05/04/2015  . No energy 05/04/2015  . Snoring 05/04/2015  . CKD (chronic kidney disease) stage 3, GFR 30-59 ml/min (HCC) 04/10/2015  . Hyperlipidemia 04/10/2015  . Thyroid activity decreased 04/08/2015  . Lower leg edema 04/08/2015    Assessment & Plan: Acute appendicitis.  Plan laparoscopic appendectomy    Matt B. Hassell Done, MD, [facility] [phone] beeper [phone]  06/07/2017 10:58 PM

## 2017-06-08 ENCOUNTER — Encounter (HOSPITAL_COMMUNITY): Payer: Self-pay | Admitting: Surgery

## 2017-06-08 ENCOUNTER — Other Ambulatory Visit: Payer: Self-pay

## 2017-06-08 DIAGNOSIS — K3533 Acute appendicitis with perforation and localized peritonitis, with abscess: Secondary | ICD-10-CM | POA: Diagnosis not present

## 2017-06-08 DIAGNOSIS — Z9049 Acquired absence of other specified parts of digestive tract: Secondary | ICD-10-CM

## 2017-06-08 LAB — BASIC METABOLIC PANEL
ANION GAP: 9 (ref 5–15)
BUN: 15 mg/dL (ref 6–20)
CALCIUM: 8.7 mg/dL — AB (ref 8.9–10.3)
CO2: 25 mmol/L (ref 22–32)
Chloride: 102 mmol/L (ref 101–111)
Creatinine, Ser: 1.22 mg/dL — ABNORMAL HIGH (ref 0.44–1.00)
GFR calc Af Amer: 53 mL/min — ABNORMAL LOW (ref >60)
GFR, EST NON AFRICAN AMERICAN: 46 mL/min — AB (ref >60)
GLUCOSE: 238 mg/dL — AB (ref 65–99)
POTASSIUM: 4.5 mmol/L (ref 3.5–5.1)
SODIUM: 136 mmol/L (ref 135–145)

## 2017-06-08 LAB — ABO/RH: ABO/RH(D): O POS

## 2017-06-08 MED ORDER — HYDRALAZINE HCL 20 MG/ML IJ SOLN: 10.0000 mg | INTRAMUSCULAR | Status: DC | PRN

## 2017-06-08 MED ORDER — PIPERACILLIN-TAZOBACTAM 3.375 G IVPB
3.3750 g | Freq: Three times a day (TID) | INTRAVENOUS | Status: DC
  Administered 2017-06-08 – 2017-06-09 (×3): 3.375 g via INTRAVENOUS
  Filled 2017-06-08 (×4): qty 50

## 2017-06-08 MED ORDER — SUGAMMADEX SODIUM 200 MG/2ML IV SOLN
INTRAVENOUS | Status: DC | PRN
  Administered 2017-06-08: 150 mg via INTRAVENOUS

## 2017-06-08 MED ORDER — HEPARIN SODIUM (PORCINE) 5000 UNIT/ML IJ SOLN
5000.0000 [IU] | Freq: Three times a day (TID) | INTRAMUSCULAR | Status: DC
  Administered 2017-06-08 – 2017-06-09 (×3): 5000 [IU] via SUBCUTANEOUS
  Filled 2017-06-08 (×3): qty 1

## 2017-06-08 MED ORDER — KCL IN DEXTROSE-NACL 20-5-0.45 MEQ/L-%-% IV SOLN
INTRAVENOUS | Status: DC
  Administered 2017-06-08 (×3): via INTRAVENOUS
  Filled 2017-06-08 (×5): qty 1000

## 2017-06-08 MED ORDER — OXYCODONE HCL 5 MG PO TABS: 5.0000 mg | ORAL_TABLET | ORAL | Status: DC | PRN

## 2017-06-08 MED ORDER — LACTATED RINGERS IV SOLN
INTRAVENOUS | Status: AC | PRN
  Administered 2017-06-08: 1000 mL

## 2017-06-08 MED ORDER — HYDROCODONE-ACETAMINOPHEN 5-325 MG PO TABS: 1.0000 | ORAL_TABLET | ORAL | Status: DC | PRN

## 2017-06-08 MED ORDER — AMOXICILLIN-POT CLAVULANATE 875-125 MG PO TABS: 1.0000 | ORAL_TABLET | Freq: Two times a day (BID) | ORAL | 0 refills | Status: AC

## 2017-06-08 MED ORDER — EPHEDRINE SULFATE-NACL 50-0.9 MG/10ML-% IV SOSY
PREFILLED_SYRINGE | INTRAVENOUS | Status: DC | PRN
  Administered 2017-06-07 (×3): 10 mg via INTRAVENOUS
  Administered 2017-06-07: 5 mg via INTRAVENOUS

## 2017-06-08 MED ORDER — HYDROCODONE-ACETAMINOPHEN 5-325 MG PO TABS: 1.0000 | ORAL_TABLET | ORAL | 0 refills | Status: DC | PRN

## 2017-06-08 MED ORDER — PHENOL 1.4 % MT LIQD
1.0000 | OROMUCOSAL | Status: DC | PRN
  Filled 2017-06-08: qty 177

## 2017-06-08 MED ORDER — MORPHINE SULFATE (PF) 4 MG/ML IV SOLN
1.0000 mg | INTRAVENOUS | Status: DC | PRN
Start: 2017-06-08 — End: 2017-06-08

## 2017-06-08 MED ORDER — PANTOPRAZOLE SODIUM 40 MG IV SOLR: 40.0000 mg | Freq: Every day | INTRAVENOUS | Status: DC

## 2017-06-08 MED ORDER — PANTOPRAZOLE SODIUM 40 MG PO TBEC
40.0000 mg | DELAYED_RELEASE_TABLET | Freq: Every day | ORAL | Status: DC
  Administered 2017-06-08: 40 mg via ORAL
  Filled 2017-06-08: qty 1

## 2017-06-08 MED ORDER — CELECOXIB 200 MG PO CAPS
200.0000 mg | ORAL_CAPSULE | Freq: Two times a day (BID) | ORAL | Status: DC
  Administered 2017-06-08 (×2): 200 mg via ORAL
  Filled 2017-06-08 (×2): qty 1

## 2017-06-08 MED ORDER — MORPHINE SULFATE (PF) 4 MG/ML IV SOLN: 2.0000 mg | INTRAVENOUS | Status: DC | PRN

## 2017-06-08 MED ORDER — ONDANSETRON 4 MG PO TBDP: 4.0000 mg | ORAL_TABLET | Freq: Four times a day (QID) | ORAL | Status: DC | PRN

## 2017-06-08 MED ORDER — ONDANSETRON HCL 4 MG/2ML IJ SOLN
4.0000 mg | Freq: Four times a day (QID) | INTRAMUSCULAR | Status: DC | PRN
  Administered 2017-06-08 (×2): 4 mg via INTRAVENOUS
  Filled 2017-06-08: qty 2

## 2017-06-08 MED ORDER — ONDANSETRON HCL 4 MG/2ML IJ SOLN
INTRAMUSCULAR | Status: AC
  Filled 2017-06-08: qty 2

## 2017-06-08 MED ORDER — GABAPENTIN 300 MG PO CAPS
300.0000 mg | ORAL_CAPSULE | Freq: Two times a day (BID) | ORAL | Status: DC
  Administered 2017-06-08 (×2): 300 mg via ORAL
  Filled 2017-06-08 (×2): qty 1

## 2017-06-08 NOTE — Discharge Instructions (Signed)
Please arrive at least 30 min before your appointment to complete your check in paperwork.  If you are unable to arrive 30 min prior to your appointment time we may have to cancel or reschedule you.  LAPAROSCOPIC SURGERY: POST OP INSTRUCTIONS  1. DIET: Follow a light bland diet the first 24 hours after arrival home, such as soup, liquids, crackers, etc. Be sure to include lots of fluids daily. Avoid fast food or heavy meals as your are more likely to get nauseated. Eat a low fat the next few days after surgery.  2. Take your usually prescribed home medications unless otherwise directed. 3. PAIN CONTROL:  1. Pain is best controlled by a usual combination of three different methods TOGETHER:  1. Ice/Heat 2. Over the counter pain medication 3. Prescription pain medication 2. Most patients will experience some swelling and bruising around the incisions. Ice packs or heating pads (30-60 minutes up to 6 times a day) will help. Use ice for the first few days to help decrease swelling and bruising, then switch to heat to help relax tight/sore spots and speed recovery. Some people prefer to use ice alone, heat alone, alternating between ice & heat. Experiment to what works for you. Swelling and bruising can take several weeks to resolve.  3. It is helpful to take an over-the-counter pain medication regularly for the first few weeks. Choose one of the following that works best for you:  1. Naproxen (Aleve, etc) Two 220mg  tabs twice a day 2. Ibuprofen (Advil, etc) Three 200mg  tabs four times a day (every meal & bedtime) 3. Acetaminophen (Tylenol, etc) 500-650mg  four times a day (every meal & bedtime) 4. A prescription for pain medication (such as oxycodone, hydrocodone, etc) should be given to you upon discharge. Take your pain medication as prescribed.  1. If you are having problems/concerns with the prescription medicine (does not control pain, nausea, vomiting, rash, itching, etc), please call us [phone]  [phone] to see if we need to switch you to a different pain medicine that will work better for you and/or control your side effect better. 2. If you need a refill on your pain medication, please contact your pharmacy. They will contact our office to request authorization. Prescriptions will not be filled after 5 pm or on week-ends. 4. Avoid getting constipated. Between the surgery and the pain medications, it is common to experience some constipation. Increasing fluid intake and taking a fiber supplement (such as Metamucil, Citrucel, FiberCon, MiraLax, etc) 1-2 times a day regularly will usually help prevent this problem from occurring. A mild laxative (prune juice, Milk of Magnesia, MiraLax, etc) should be taken according to package directions if there are no bowel movements after 48 hours.  5. Watch out for diarrhea. If you have many loose bowel movements, simplify your diet to bland foods & liquids for a few days. Stop any stool softeners and decrease your fiber supplement. Switching to mild anti-diarrheal medications (Kayopectate, Pepto Bismol) can help. If this worsens or does not improve, please call us. 6. Shower every day. You may shower over the dressings as they are waterproof. Continue to shower over incision(s) after the dressing is off. Do not submerge in a bath for 2-3 weeks.  7. ACTIVITIES as tolerated:  1. You may resume regular (light) daily activities beginning the next day--such as daily self-care, walking, climbing stairs--gradually increasing activities as tolerated. If you can walk 30 minutes without difficulty, it is safe to try more intense activity such as jogging, treadmill,  bicycling, low-impact aerobics, swimming, etc. 2. Save the most intensive and strenuous activity for last such as sit-ups, heavy lifting, contact sports, etc Refrain from any heavy lifting or straining until you are off narcotics for pain control.  3. DO NOT PUSH THROUGH PAIN. Let pain be your guide: If it  hurts to do something, don't do it. Pain is your body warning you to avoid that activity for another week until the pain goes down. 4. You may drive when you are no longer taking prescription pain medication, you can comfortably wear a seatbelt, and you can safely maneuver your car and apply brakes. 5. You may have sexual intercourse when it is comfortable.  6. No lifting >20lbs for 4 weeks. 8. FOLLOW UP in our office  1. Please call [facility] at [phone] to set up an appointment to see your surgeon in the office for a follow-up appointment approximately 2-3 weeks after your surgery. 2. Make sure that you call for this appointment the day you arrive home to insure a convenient appointment time.      10. IF YOU HAVE DISABILITY OR FAMILY LEAVE FORMS, BRING THEM TO THE               OFFICE FOR PROCESSING.   WHEN TO CALL us [phone]:  1. Poor pain control 2. Reactions / problems with new medications (rash/itching, nausea, etc)  3. Fever over 101.5 F (38.5 C) 4. Inability to urinate 5. Nausea and/or vomiting 6. Worsening swelling or bruising 7. Continued bleeding from incision. 8. Increased pain, redness, or drainage from the incision  The clinic staff is available to answer your questions during regular business hours (8:30am-5pm). Please dont hesitate to call and ask to speak to one of our nurses for clinical concerns.  If you have a medical emergency, go to the nearest emergency room or call 911.  A surgeon from [facility] is always on call at the [facility], [place], [place], [place], [place] [address] ?  MAIN: [phone] ? TOLL FREE: [phone] ?  FAX [phone]) [phone]  [email or web address]

## 2017-06-08 NOTE — Op Note (Signed)
Surgeon: Kaylyn Lim, MD, FACS  Asst:  None  Anes:  General  Preop Dx: Acute appendicitis Postop Dx: Acute appendicitis  Procedure: Laparoscopic appendectomy Location Surgery: [place] [facility] Complications: None noted  EBL:   5 cc  Drains: None  Description of Procedure:  The patient was taken to OR 1.  After anesthesia was administered and the patient was prepped a timeout was performed.  The previous infraumbilical transverse incision was re-incised and the fascia was cut longitudinally and the abdomen was entered with a Kelly clamp gently and the Kelly placed without difficulty.  Following insufflation 2 5 mm trochars were placed in the right upper quadrant and left lower quadrant.  The appendix was fairly long and came off the base of the pin of the cecum and away where the first third of the appendix was actually extraperitoneal.  I freed that up with the harmonic scalpel and isolated the base.  I amputated the base of the appendix with a 4.5 mm stapler Ethicon with a vascular load.  The mesentery of the appendix was transected with the Harmonic scalpel and then it was placed in a bag and brought out through the umbilicus.  I irrigated the area and there is no bleeding noted.  The stoma appeared viable.  I then repaired the umbilical defect with a figure-of-eight suture of 0 Vicryl and then inspected with the laparoscope.  I looked around and saw no other abnormalities noted in the abdomen.  The abdomen was deflated and the incisions were closed with 4-0 Monocryl.  The patient tolerated the procedure well and was taken to the PACU in stable condition.     Matt B. Hassell Done, [place], [facility], [place]

## 2017-06-08 NOTE — Progress Notes (Signed)
[facility]/Trauma Progress Note  1 Day Post-Op   Assessment/Plan  CKD stage III  Appendicitis - S/P lap appy, Dr. Hassell Done, 11/07  FEN: soft diet VTE: SCD's, heparin ID: Zosyn 11/07>> Foley: none Follow up: [place] [facility] 2 weeks  DISPO: If pt tolerates diet and walks this afternoon she can be discharged. 5 days of abx at discharge    LOS: 0 days    Subjective:  CC: nausea  Mild abdominal pain. No vomiting. Husband at bedside states pt has nausea after surgery. She has been out of bed to urinate.   Objective: Vital signs in last 24 hours: Temp:  [97.3 F (36.3 C)-98.7 F (37.1 C)] 97.3 F (36.3 C) (11/08 0558) Pulse Rate:  [50-90] 54 (11/08 0558) Resp:  [7-16] 14 (11/08 0558) BP: (108-138)/(68-92) 109/70 (11/08 0558) SpO2:  [96 %-100 %] 100 % (11/08 0558) Weight:  [158 lb 15.2 oz (72.1 kg)-159 lb (72.1 kg)] 158 lb 15.2 oz (72.1 kg) (11/08 0215) Last BM Date: 06/06/17  Intake/Output from previous day: 11/07 0701 - 11/08 0700 In: 1371.7 [I.V.:1371.7] Out: 315 [Urine:300; Blood:15] Intake/Output this shift: Total I/O In: -  Out: 600 [Urine:600]  PE: Gen:  Alert, NAD, pleasant, cooperative Card:  RRR, no M/G/R heard Pulm:  CTA, no W/R/R, effort normal Abd: Soft, not distended, +BS, incisions with glue intact without drainage, TTP in RLQ without guarding Skin: no rashes noted, warm and dry   Anti-infectives: Anti-infectives (From admission, onward)   Start     Dose/Rate Route Frequency Ordered Stop   06/07/17 2200  piperacillin-tazobactam (ZOSYN) IVPB 3.375 g     3.375 g 100 mL/hr over 30 Minutes Intravenous  Once 06/07/17 2153 06/07/17 2317      Lab Results:  Recent Labs    06/07/17 2239  WBC 10.1  HGB 11.8*  HCT 35.0*  PLT 287   BMET Recent Labs    06/07/17 2239  NA 137  K 3.0*  CL 102  CO2 25  GLUCOSE 137*  BUN 16  CREATININE 1.25*  CALCIUM 9.1   PT/INR Recent Labs    06/07/17 2239  LABPROT 13.2  INR 1.01   CMP      Component Value Date/Time   NA 137 06/07/2017 2239   K 3.0 (L) 06/07/2017 2239   CL 102 06/07/2017 2239   CO2 25 06/07/2017 2239   GLUCOSE 137 (H) 06/07/2017 2239   BUN 16 06/07/2017 2239   CREATININE 1.25 (H) 06/07/2017 2239   CREATININE 1.16 (H) 10/19/2015 1559   CALCIUM 9.1 06/07/2017 2239   PROT 7.1 06/07/2017 2239   ALBUMIN 3.7 06/07/2017 2239   AST 19 06/07/2017 2239   ALT 13 (L) 06/07/2017 2239   ALKPHOS 86 06/07/2017 2239   BILITOT 0.5 06/07/2017 2239   GFRNONAA 44 (L) 06/07/2017 2239   GFRNONAA 51 (L) 10/19/2015 1559   GFRAA 52 (L) 06/07/2017 2239   GFRAA 58 (L) 10/19/2015 1559   Lipase  No results found for: LIPASE  Studies/Results: Ct Abdomen Pelvis W Contrast  Result Date: 06/07/2017 CLINICAL DATA:  Right lower, left lower and epigastric abdominal pain and nausea. Absent bowel sounds. Abdominal guarding. EXAM: CT ABDOMEN AND PELVIS WITH CONTRAST TECHNIQUE: Multidetector CT imaging of the abdomen and pelvis was performed using the standard protocol following bolus administration of intravenous contrast. CONTRAST:  162mL ISOVUE-300 IOPAMIDOL (ISOVUE-300) INJECTION 61% COMPARISON:  06/10/2013 renal sonogram. FINDINGS: Lower chest: No significant pulmonary nodules or acute consolidative airspace disease. Hepatobiliary: Normal liver size. Subcentimeter hypodense  posterior right liver lobe lesion, too small to characterize, for which no follow-up is required unless the patient has risk factors for liver malignancy. No additional liver lesions. Normal gallbladder with no radiopaque cholelithiasis. No biliary ductal dilatation. Pancreas: Normal, with no mass or duct dilation. Spleen: Normal size. No mass. Adrenals/Urinary Tract: Normal adrenals. Normal kidneys with no hydronephrosis and no renal mass. Normal bladder. Stomach/Bowel: Small hiatal hernia. Otherwise normal nondistended stomach. Normal caliber small bowel with no small bowel wall thickening. Appendix is diffusely dilated  and thick walled with mild periappendiceal fat stranding, compatible with acute appendicitis. Appendix: Location: Right lower quadrant. Diameter: 13 mm Appendicolith: Not present. Mucosal hyper-enhancement: Present. Extraluminal gas: Not present. Periappendiceal collection: Not present. Mild presumably reactive wall thickening at the base of the cecum. No additional sites of large bowel wall thickening or pericolonic fat stranding. Oral contrast progresses to the left colon. No significant diverticulosis. Vascular/Lymphatic: Atherosclerotic nonaneurysmal abdominal aorta. Patent portal, splenic, hepatic and renal veins. No pathologically enlarged lymph nodes in the abdomen or pelvis. Reproductive: Status post hysterectomy, with no abnormal findings at the vaginal cuff. No adnexal mass. Other: No pneumoperitoneum, ascites or focal fluid collection. Musculoskeletal: No aggressive appearing focal osseous lesions. Minimal thoracolumbar spondylosis. IMPRESSION: 1. Acute appendicitis.  No abscess.  No free air. 2. Mild wall thickening at the base of the cecum, presumably reactive. 3. Chronic findings include: Aortic Atherosclerosis (ICD10-I70.0). Small hiatal hernia. These results were called by telephone at the time of interpretation on 06/07/2017 at 7:52 pm to [place] , who verbally acknowledged these results. Electronically Signed   By: Ilona Sorrel M.D.   On: 06/07/2017 19:54      Kalman Drape , [facility] 06/08/2017, 9:41 AM Pager: [phone] Consults: [phone] Mon-Fri 7:00 am-4:30 pm Sat-Sun 7:00 am-11:30 am

## 2017-06-08 NOTE — Progress Notes (Signed)
The patient is receiving Protonix by the intravenous route.  Based on criteria approved by the Pharmacy and [facility], the medication is being converted to the equivalent oral dose form.  These criteria include: -No active GI bleeding -Able to tolerate diet of full liquids (or better) or tube feeding -Able to tolerate other medications by the oral or enteral route  If you have any questions about this conversion, please contact the Pharmacy Department (phone [phone]).  Thank you. Eudelia Bunch, Pharm.D. [phone] 06/08/2017 1:54 PM

## 2017-06-08 NOTE — Anesthesia Procedure Notes (Signed)
Date/Time: 06/08/2017 12:53 AM Performed by: Cynda Familia, CRNA Pre-anesthesia Checklist: Emergency Drugs available, Suction available and Patient being monitored Oxygen Delivery Method: Simple face mask Placement Confirmation: positive ETCO2 and breath sounds checked- equal and bilateral Dental Injury: Teeth and Oropharynx as per pre-operative assessment  Comments: Extubated to face mask

## 2017-06-08 NOTE — Transfer of Care (Signed)
Immediate Anesthesia Transfer of Care Note  Patient: Susan Figueroa  Procedure(s) Performed: APPENDECTOMY LAPAROSCOPIC (N/A )  Patient Location: PACU  Anesthesia Type:General  Level of Consciousness: sedated  Airway & Oxygen Therapy: Patient Spontanous Breathing and Patient connected to face mask oxygen  Post-op Assessment: Report given to RN and Post -op Vital signs reviewed and stable  Post vital signs: Reviewed and stable  Last Vitals:  Vitals:   06/07/17 2131 06/08/17 0105  BP: (!) 138/92   Pulse: 73   Resp: 16   Temp: 36.9 C (!) (P) 36.4 C  SpO2: 96%     Last Pain:  Vitals:   06/07/17 2225  TempSrc:   PainSc: 5          Complications: No apparent anesthesia complications

## 2017-06-09 NOTE — Progress Notes (Signed)
Discussed with patient discharge instructions, she verbalized agreement and understanding.  Patient to go home with all belongings to go home in private vehicle. 

## 2017-06-09 NOTE — Discharge Summary (Signed)
[facility]/Trauma Discharge Summary   Patient ID: Susan Figueroa MRN: [id number] DOB/AGE: November 18, 1950 66 y.o.  Admit date: 06/07/2017 Discharge date: 06/09/2017  Admitting Diagnosis: appendicitis  Discharge Diagnosis Patient Active Problem List   Diagnosis Date Noted  . Acute appendicitis with suppurative peritonitis s/p lap appendectomy 06/08/2017 06/08/2017  . Right lower quadrant abdominal pain 06/07/2017  . Absent bowel sounds 06/07/2017  . Left lower quadrant pain 06/07/2017  . Pure hypercholesterolemia 06/07/2017  . Abdominal guarding 06/07/2017  . Chronic cystitis 04/15/2016  . Abdominal discomfort 04/15/2016  . Dysuria 04/15/2016  . Leukocytes in urine 04/15/2016  . Urinary incontinence in female 12/25/2015  . Dry mouth 10/20/2015  . Weight gain 10/20/2015  . Dry skin 10/20/2015  . Sleep apnea 06/30/2015  . Raynaud's disease without gangrene 05/04/2015  . No energy 05/04/2015  . Snoring 05/04/2015  . CKD (chronic kidney disease) stage 3, GFR 30-59 ml/min (HCC) 04/10/2015  . Hyperlipidemia 04/10/2015  . Thyroid activity decreased 04/08/2015  . Lower leg edema 04/08/2015    Consultants None  Imaging: Ct Abdomen Pelvis W Contrast  Result Date: 06/07/2017 CLINICAL DATA:  Right lower, left lower and epigastric abdominal pain and nausea. Absent bowel sounds. Abdominal guarding. EXAM: CT ABDOMEN AND PELVIS WITH CONTRAST TECHNIQUE: Multidetector CT imaging of the abdomen and pelvis was performed using the standard protocol following bolus administration of intravenous contrast. CONTRAST:  142mL ISOVUE-300 IOPAMIDOL (ISOVUE-300) INJECTION 61% COMPARISON:  06/10/2013 renal sonogram. FINDINGS: Lower chest: No significant pulmonary nodules or acute consolidative airspace disease. Hepatobiliary: Normal liver size. Subcentimeter hypodense posterior right liver lobe lesion, too small to characterize, for which no follow-up is required unless the patient has risk factors  for liver malignancy. No additional liver lesions. Normal gallbladder with no radiopaque cholelithiasis. No biliary ductal dilatation. Pancreas: Normal, with no mass or duct dilation. Spleen: Normal size. No mass. Adrenals/Urinary Tract: Normal adrenals. Normal kidneys with no hydronephrosis and no renal mass. Normal bladder. Stomach/Bowel: Small hiatal hernia. Otherwise normal nondistended stomach. Normal caliber small bowel with no small bowel wall thickening. Appendix is diffusely dilated and thick walled with mild periappendiceal fat stranding, compatible with acute appendicitis. Appendix: Location: Right lower quadrant. Diameter: 13 mm Appendicolith: Not present. Mucosal hyper-enhancement: Present. Extraluminal gas: Not present. Periappendiceal collection: Not present. Mild presumably reactive wall thickening at the base of the cecum. No additional sites of large bowel wall thickening or pericolonic fat stranding. Oral contrast progresses to the left colon. No significant diverticulosis. Vascular/Lymphatic: Atherosclerotic nonaneurysmal abdominal aorta. Patent portal, splenic, hepatic and renal veins. No pathologically enlarged lymph nodes in the abdomen or pelvis. Reproductive: Status post hysterectomy, with no abnormal findings at the vaginal cuff. No adnexal mass. Other: No pneumoperitoneum, ascites or focal fluid collection. Musculoskeletal: No aggressive appearing focal osseous lesions. Minimal thoracolumbar spondylosis. IMPRESSION: 1. Acute appendicitis.  No abscess.  No free air. 2. Mild wall thickening at the base of the cecum, presumably reactive. 3. Chronic findings include: Aortic Atherosclerosis (ICD10-I70.0). Small hiatal hernia. These results were called by telephone at the time of interpretation on 06/07/2017 at 7:52 pm to [place] , who verbally acknowledged these results. Electronically Signed   By: Ilona Sorrel M.D.   On: 06/07/2017 19:54    Procedures Dr. Hassell Done (06/08/17) -  Laparoscopic Appendectomy  Hospital Course:  Pt is a 66 year old female who presented to ED with abdominal pain.  Workup showed appendicitis.  Patient was admitted and underwent procedure listed above.  Tolerated procedure well and  was transferred to the floor.  Diet was advanced as tolerated.  On POD#1, the patient was voiding well, tolerating diet, ambulating well, pain well controlled, vital signs stable, incisions c/d/i and felt stable for discharge home.  Patient will follow up in our office in 2 weeks and knows to call with questions or concerns.  She will call to confirm appointment date/time.    Patient was discharged in good condition.  The [place] Substance controlled database was reviewed prior to prescribing narcotic pain medication to this patient.  Physical Exam: Gen:  Alert, NAD, pleasant, cooperative Card:  RRR, no M/G/R heard Pulm:  CTA, no W/R/R, effort normal Abd: Soft, not distended, +BS, incisions with glue intact without drainage, mild TTP in RLQ without guarding Skin: no rashes noted, warm and dry    Allergies as of 06/09/2017   No Known Allergies     Medication List    TAKE these medications   amoxicillin-clavulanate 875-125 MG tablet Commonly known as:  AUGMENTIN Take 1 tablet every 12 (twelve) hours for 5 days by mouth.   aspirin 81 MG tablet Take 81 mg by mouth daily.   atorvastatin 40 MG tablet Commonly known as:  LIPITOR Take 1 tablet (40 mg total) daily by mouth.   hydrochlorothiazide 25 MG tablet Commonly known as:  HYDRODIURIL TAKE 1 TABLET BY MOUTH EVERY DAY ONCE A DAY ORALLY 30 DAYS   HYDROcodone-acetaminophen 5-325 MG tablet Commonly known as:  NORCO/VICODIN Take 1 tablet every 4 (four) hours as needed by mouth for moderate pain.   levothyroxine 50 MCG tablet Commonly known as:  SYNTHROID, LEVOTHROID TAKE 1 TABLET (50 MCG TOTAL) BY MOUTH DAILY BEFORE BREAKFAST. MUST HAVE LAB WORK FOR FUTURE REFILLS.   Magnesium 500 MG Caps Take  1 capsule daily by mouth.   Potassium Chloride Gran Take 1 capsule daily by mouth.   Vitamin D3 2000 units Tabs Take 2,000 Int'l Units by mouth daily.        Follow-up Information    [facility], [place] Go on 06/27/2017.   Specialty:  General Surgery Why:  Your appointment is at 10 AM. Please arrive 30 min prior to appointment time. Bring photo ID and insurance information.  Contact information: [place] [address] [phone]           Signed: [facility] 06/09/2017, 8:53 AM Pager: [phone] Consults: [phone] Mon-Fri 7:00 am-4:30 pm Sat-Sun 7:00 am-11:30 am

## 2017-06-12 NOTE — Anesthesia Postprocedure Evaluation (Signed)
Anesthesia Post Note  Patient: Susan Figueroa  Procedure(s) Performed: APPENDECTOMY LAPAROSCOPIC (N/A )     Patient location during evaluation: PACU Anesthesia Type: General Level of consciousness: awake and alert Pain management: pain level controlled Vital Signs Assessment: post-procedure vital signs reviewed and stable Respiratory status: spontaneous breathing, nonlabored ventilation, respiratory function stable and patient connected to nasal cannula oxygen Cardiovascular status: blood pressure returned to baseline and stable Postop Assessment: no apparent nausea or vomiting Anesthetic complications: no    Last Vitals:  Vitals:   06/08/17 2125 06/09/17 0531  BP: 105/62 117/66  Pulse: (!) 54 (!) 45  Resp: 17 17  Temp: 36.8 C 36.7 C  SpO2: 97% 99%    Last Pain:  Vitals:   06/09/17 0725  TempSrc:   PainSc: 0-No pain                 Ragan Duhon S

## 2017-07-13 ENCOUNTER — Other Ambulatory Visit: Payer: Self-pay | Admitting: Physician Assistant

## 2017-08-08 ENCOUNTER — Other Ambulatory Visit: Payer: Self-pay | Admitting: Physician Assistant

## 2017-08-08 ENCOUNTER — Other Ambulatory Visit: Payer: Self-pay | Admitting: *Deleted

## 2017-08-08 MED ORDER — LEVOTHYROXINE SODIUM 50 MCG PO TABS: 50.0000 ug | ORAL_TABLET | Freq: Every day | ORAL | 0 refills | Status: DC

## 2017-09-06 ENCOUNTER — Other Ambulatory Visit: Payer: Self-pay | Admitting: Physician Assistant

## 2017-09-15 DIAGNOSIS — E785 Hyperlipidemia, unspecified: Secondary | ICD-10-CM | POA: Diagnosis not present

## 2017-09-15 LAB — BASIC METABOLIC PANEL: Creatinine: 1.4 — AB (ref 0.5–1.1)

## 2017-09-15 LAB — LIPID PANEL
Cholesterol: 150 (ref 0–200)
HDL: 56 (ref 35–70)
LDL CALC: 78
Triglycerides: 80 (ref 40–160)

## 2017-09-17 ENCOUNTER — Emergency Department
Admission: EM | Admit: 2017-09-17 | Discharge: 2017-09-17 | Discharge status: Absent without leave | Payer: BLUE CROSS/BLUE SHIELD | Source: Home / Self Care

## 2017-09-18 ENCOUNTER — Other Ambulatory Visit: Payer: Self-pay | Admitting: Physician Assistant

## 2017-09-22 DIAGNOSIS — E785 Hyperlipidemia, unspecified: Secondary | ICD-10-CM | POA: Diagnosis not present

## 2017-10-10 ENCOUNTER — Other Ambulatory Visit: Payer: Self-pay | Admitting: Physician Assistant

## 2017-10-23 ENCOUNTER — Encounter: Payer: Self-pay | Admitting: Physician Assistant

## 2017-10-23 ENCOUNTER — Ambulatory Visit: Payer: BLUE CROSS/BLUE SHIELD | Admitting: Physician Assistant

## 2017-10-23 ENCOUNTER — Telehealth: Payer: Self-pay | Admitting: Physician Assistant

## 2017-10-23 VITALS — BP 128/84 | HR 64 | Ht 67.0 in | Wt 164.0 lb

## 2017-10-23 DIAGNOSIS — M1812 Unilateral primary osteoarthritis of first carpometacarpal joint, left hand: Secondary | ICD-10-CM

## 2017-10-23 DIAGNOSIS — E039 Hypothyroidism, unspecified: Secondary | ICD-10-CM

## 2017-10-23 DIAGNOSIS — N183 Chronic kidney disease, stage 3 unspecified: Secondary | ICD-10-CM

## 2017-10-23 DIAGNOSIS — M1811 Unilateral primary osteoarthritis of first carpometacarpal joint, right hand: Secondary | ICD-10-CM | POA: Diagnosis not present

## 2017-10-23 DIAGNOSIS — R600 Localized edema: Secondary | ICD-10-CM | POA: Diagnosis not present

## 2017-10-23 DIAGNOSIS — E782 Mixed hyperlipidemia: Secondary | ICD-10-CM

## 2017-10-23 DIAGNOSIS — M180 Bilateral primary osteoarthritis of first carpometacarpal joints: Secondary | ICD-10-CM | POA: Insufficient documentation

## 2017-10-23 MED ORDER — ATORVASTATIN CALCIUM 40 MG PO TABS: 40.0000 mg | ORAL_TABLET | Freq: Every day | ORAL | 3 refills | Status: DC

## 2017-10-23 MED ORDER — HYDROCHLOROTHIAZIDE 25 MG PO TABS: ORAL_TABLET | ORAL | 1 refills | Status: DC

## 2017-10-23 MED ORDER — LEVOTHYROXINE SODIUM 50 MCG PO TABS: 50.0000 ug | ORAL_TABLET | Freq: Every day | ORAL | 0 refills | Status: DC

## 2017-10-23 MED ORDER — DICLOFENAC SODIUM 1 % TD GEL: 4.0000 g | Freq: Four times a day (QID) | TRANSDERMAL | 5 refills | Status: DC

## 2017-10-23 NOTE — Telephone Encounter (Signed)
Pt needs a colonoscopy. Is she ok for me to place order?

## 2017-10-23 NOTE — Progress Notes (Signed)
Subjective:    Patient ID: Susan Figueroa, female    DOB: 1951-04-17, 67 y.o.   MRN: [id number]  HPI Pt is a 67 yo female with hypothyroidism, bilateral lower leg edema, hyperlipidemia who presents to the clinic for 6 month follow up.   She started lipitor and brought in new fasting labs. She would like to go over labs. Denies any side effects myalgias.   Hypothyroidism- doing well and taking medication every morning. No problems or concerns.   She uses HCTZ occasionally for lower leg swelling.   .. Active Ambulatory Problems    Diagnosis Date Noted  . Thyroid activity decreased 04/08/2015  . Lower leg edema 04/08/2015  . CKD (chronic kidney disease) stage 3, GFR 30-59 ml/min (HCC) 04/10/2015  . Hyperlipidemia 04/10/2015  . Raynaud's disease without gangrene 05/04/2015  . No energy 05/04/2015  . Snoring 05/04/2015  . Sleep apnea 06/30/2015  . Dry mouth 10/20/2015  . Dry skin 10/20/2015  . Urinary incontinence in female 12/25/2015  . Chronic cystitis 04/15/2016  . Dysuria 04/15/2016  . Pure hypercholesterolemia 06/07/2017  . Acute appendicitis with suppurative peritonitis s/p lap appendectomy 06/08/2017 06/08/2017   Resolved Ambulatory Problems    Diagnosis Date Noted  . Weight gain 10/20/2015  . Abdominal discomfort 04/15/2016  . Leukocytes in urine 04/15/2016  . Right lower quadrant abdominal pain 06/07/2017  . Absent bowel sounds 06/07/2017  . Left lower quadrant pain 06/07/2017  . Abdominal guarding 06/07/2017  . Status post laparoscopic appendectomy 06/08/2017   Past Medical History:  Diagnosis Date  . Acute appendicitis   . Thyroid disease       Review of Systems  All other systems reviewed and are negative.      Objective:   Physical Exam  Constitutional: She is oriented to person, place, and time. She appears well-developed and well-nourished.  HENT:  Head: Normocephalic and atraumatic.  Cardiovascular: Normal rate, regular rhythm and normal heart  sounds.  Pulmonary/Chest: Effort normal and breath sounds normal.  Neurological: She is alert and oriented to person, place, and time.  Psychiatric: She has a normal mood and affect. Her behavior is normal.          Assessment & Plan:  Marland KitchenMarland KitchenYaslyn was seen today for hypothyroidism.  Diagnoses and all orders for this visit:  Mixed hyperlipidemia -     atorvastatin (LIPITOR) 40 MG tablet; Take 1 tablet (40 mg total) by mouth daily.  Acquired hypothyroidism -     levothyroxine (SYNTHROID, LEVOTHROID) 50 MCG tablet; Take 1 tablet (50 mcg total) by mouth daily before breakfast.  CKD (chronic kidney disease) stage 3, GFR 30-59 ml/min (HCC)  Arthritis of carpometacarpal (CMC) joint of both thumbs -     diclofenac sodium (VOLTAREN) 1 % GEL; Apply 4 g topically 4 (four) times daily. To affected joint.  Bilateral lower extremity edema -     hydrochlorothiazide (HYDRODIURIL) 25 MG tablet; TAKE 1 TABLET BY MOUTH EVERY DAY  .[name] Depression screen [facility] 2/9 10/23/2017  Decreased Interest 0  Down, Depressed, Hopeless 0  PHQ - 2 Score 0   LDL improved to 78, HDL 56, TG 80. Stay on lipitor.   Serum creatine 1.38. Up a little from baseline 1.22.   Suggest not taking any oral NSAIDs due to kidney function. Replaced with topical NSAID. Can use tylenol arthritis. Decrease HCTZ to as needed for bilateral lower extremity swelling.   TSH and CMP to be checked in 2 months. Please make sure I get copy  to adjust medications or change treatment plan accordingly.

## 2017-10-23 NOTE — Patient Instructions (Signed)
TSH/CMP to recheck thyroid and kidney in 2 months.

## 2017-10-23 NOTE — Telephone Encounter (Signed)
Left VM for Pt.

## 2017-10-24 ENCOUNTER — Telehealth: Payer: Self-pay | Admitting: Emergency Medicine

## 2017-10-24 DIAGNOSIS — Z1211 Encounter for screening for malignant neoplasm of colon: Secondary | ICD-10-CM

## 2017-10-24 NOTE — Telephone Encounter (Signed)
Left message on Vm to call and let us know if we can set up colonoscopy testing for her.

## 2017-10-24 NOTE — Telephone Encounter (Signed)
Referral placed.

## 2017-10-24 NOTE — Telephone Encounter (Signed)
Request colonoscopy.

## 2017-12-22 ENCOUNTER — Telehealth: Payer: Self-pay | Admitting: *Deleted

## 2017-12-22 NOTE — Telephone Encounter (Signed)
Pt left vm asking for a Rheumatology referral to Dr. Simona Huh Ang in [facility].  I don't see anything on her problem list that would need rheum.  Please advise.

## 2017-12-26 NOTE — Telephone Encounter (Signed)
What dx do you want to see rheumatology for? Arthritis of thumb would be ortho. I don't see any labs that look autoimmune.

## 2017-12-27 ENCOUNTER — Encounter: Payer: Self-pay | Admitting: Physician Assistant

## 2017-12-28 NOTE — Telephone Encounter (Signed)
Spoke with pt today and her concerns are with arthritis in her knees so I transferred her to scheduling to see a sports doc here.

## 2018-01-01 ENCOUNTER — Encounter: Payer: Self-pay | Admitting: Sports Medicine

## 2018-01-01 ENCOUNTER — Ambulatory Visit (INDEPENDENT_AMBULATORY_CARE_PROVIDER_SITE_OTHER): Payer: BLUE CROSS/BLUE SHIELD | Admitting: Sports Medicine

## 2018-01-01 DIAGNOSIS — M1711 Unilateral primary osteoarthritis, right knee: Secondary | ICD-10-CM | POA: Diagnosis not present

## 2018-01-01 MED ORDER — CELECOXIB 100 MG PO CAPS: 100.0000 mg | ORAL_CAPSULE | Freq: Every day | ORAL | 3 refills | Status: DC

## 2018-01-01 NOTE — Assessment & Plan Note (Signed)
Only with a tiny elevation in creatinine in the last check, has been relatively normal before that. Currently on HCTZ. I am going to go ahead and do a trial of Celebrex, we will do half dose, 100 to 200 mg rather than 200-400. Rehab exercises given, x-rays in the past showed osteoarthritis. Return to see me in 1 month, injection if no better.  She did have what appeared felt to be a small Baker's cyst in the back of the knee.

## 2018-01-01 NOTE — Progress Notes (Signed)
Subjective:    CC: Right knee pain  HPI: This is a pleasant 67 year old female, for several years she has had pain in her knee, right-sided, anterior, worse going up and down stairs.  Slight fullness in the posterior knee as well.  Has only been using topical diclofenac.  Renal function has been okay for the most part.  Pain is moderate, persistent, no mechanical symptoms, no radiation.  I reviewed the past medical history, family history, social history, surgical history, and allergies today and no changes were needed.  Please see the problem list section below in epic for further details.  Past Medical History: Past Medical History:  Diagnosis Date  . Acute appendicitis   . Thyroid disease    Past Surgical History: Past Surgical History:  Procedure Laterality Date  . ABDOMINAL HYSTERECTOMY    . LAPAROSCOPIC APPENDECTOMY N/A 06/07/2017   Procedure: APPENDECTOMY LAPAROSCOPIC;  Surgeon: Johnathan Hausen, MD;  Location: [facility];  Service: General;  Laterality: N/A;   Social History: Social History   Socioeconomic History  . Marital status: Married    Spouse name: Not on file  . Number of children: Not on file  . Years of education: Not on file  . Highest education level: Not on file  Occupational History  . Not on file  Social Needs  . Financial resource strain: Not on file  . Food insecurity:    Worry: Not on file    Inability: Not on file  . Transportation needs:    Medical: Not on file    Non-medical: Not on file  Tobacco Use  . Smoking status: Former Research scientist (life sciences)  . Smokeless tobacco: Never Used  Substance and Sexual Activity  . Alcohol use: No    Alcohol/week: 0.0 oz  . Drug use: No  . Sexual activity: Yes  Lifestyle  . Physical activity:    Days per week: Not on file    Minutes per session: Not on file  . Stress: Not on file  Relationships  . Social connections:    Talks on phone: Not on file    Gets together: Not on file    Attends religious service: Not on  file    Active member of club or organization: Not on file    Attends meetings of clubs or organizations: Not on file    Relationship status: Not on file  Other Topics Concern  . Not on file  Social History Narrative  . Not on file   Family History: Family History  Problem Relation Age of Onset  . Cancer Mother        cervical  . Hypertension Brother   . Stroke Maternal Grandmother   . Heart attack Maternal Grandfather    Allergies: No Known Allergies Medications: See med rec.  Review of Systems: No fevers, chills, night sweats, weight loss, chest pain, or shortness of breath.   Objective:    General: Well Developed, well nourished, and in no acute distress.  Neuro: Alert and oriented x3, extra-ocular muscles intact, sensation grossly intact.  HEENT: Normocephalic, atraumatic, pupils equal round reactive to light, neck supple, no masses, no lymphadenopathy, thyroid nonpalpable.  Skin: Warm and dry, no rashes. Cardiac: Regular rate and rhythm, no murmurs rubs or gallops, no lower extremity edema.  Respiratory: Clear to auscultation bilaterally. Not using accessory muscles, speaking in full sentences. Right knee: Normal to inspection with no erythema or effusion or obvious bony abnormalities. Minimal tenderness at the patellar facets and medial joint line ROM normal  in flexion and extension and lower leg rotation. Ligaments with solid consistent endpoints including ACL, PCL, LCL, MCL. Negative Mcmurray's and provocative meniscal tests. Non painful patellar compression. Patellar and quadriceps tendons unremarkable. Hamstring and quadriceps strength is normal.  Impression and Recommendations:    Primary osteoarthritis of right knee Only with a tiny elevation in creatinine in the last check, has been relatively normal before that. Currently on HCTZ. I am going to go ahead and do a trial of Celebrex, we will do half dose, 100 to 200 mg rather than 200-400. Rehab exercises  given, x-rays in the past showed osteoarthritis. Return to see me in 1 month, injection if no better.  She did have what appeared felt to be a small Baker's cyst in the back of the knee.  ___________________________________________ Gwen Her. Dianah Field, M.D., ABFM., CAQSM. Primary Care and [place] Instructor of [employer] [facility]

## 2018-01-16 DIAGNOSIS — D120 Benign neoplasm of cecum: Secondary | ICD-10-CM | POA: Diagnosis not present

## 2018-01-16 DIAGNOSIS — Z1211 Encounter for screening for malignant neoplasm of colon: Secondary | ICD-10-CM | POA: Diagnosis not present

## 2018-01-16 LAB — HM COLONOSCOPY

## 2018-01-17 ENCOUNTER — Other Ambulatory Visit: Payer: Self-pay | Admitting: Physician Assistant

## 2018-01-17 DIAGNOSIS — E039 Hypothyroidism, unspecified: Secondary | ICD-10-CM

## 2018-01-22 DIAGNOSIS — D126 Benign neoplasm of colon, unspecified: Secondary | ICD-10-CM | POA: Insufficient documentation

## 2018-02-06 ENCOUNTER — Encounter: Payer: Self-pay | Admitting: Physician Assistant

## 2018-03-29 ENCOUNTER — Other Ambulatory Visit: Payer: Self-pay | Admitting: Sports Medicine

## 2018-03-29 DIAGNOSIS — M1711 Unilateral primary osteoarthritis, right knee: Secondary | ICD-10-CM

## 2018-04-20 ENCOUNTER — Other Ambulatory Visit: Payer: Self-pay | Admitting: Physician Assistant

## 2018-04-20 DIAGNOSIS — E039 Hypothyroidism, unspecified: Secondary | ICD-10-CM

## 2018-04-24 DIAGNOSIS — L570 Actinic keratosis: Secondary | ICD-10-CM | POA: Diagnosis not present

## 2018-04-24 DIAGNOSIS — L814 Other melanin hyperpigmentation: Secondary | ICD-10-CM | POA: Diagnosis not present

## 2018-04-24 DIAGNOSIS — L821 Other seborrheic keratosis: Secondary | ICD-10-CM | POA: Diagnosis not present

## 2018-04-24 DIAGNOSIS — X32XXXS Exposure to sunlight, sequela: Secondary | ICD-10-CM | POA: Diagnosis not present

## 2018-05-04 ENCOUNTER — Other Ambulatory Visit: Payer: Self-pay | Admitting: Physician Assistant

## 2018-05-04 DIAGNOSIS — E039 Hypothyroidism, unspecified: Secondary | ICD-10-CM

## 2018-05-14 ENCOUNTER — Other Ambulatory Visit: Payer: Self-pay | Admitting: Physician Assistant

## 2018-05-14 DIAGNOSIS — E039 Hypothyroidism, unspecified: Secondary | ICD-10-CM

## 2018-05-22 DIAGNOSIS — Z23 Encounter for immunization: Secondary | ICD-10-CM | POA: Diagnosis not present

## 2018-06-01 DIAGNOSIS — Z0000 Encounter for general adult medical examination without abnormal findings: Secondary | ICD-10-CM | POA: Diagnosis not present

## 2018-06-02 LAB — LIPID PANEL
Cholesterol: 182 (ref 0–200)
HDL: 72 — AB (ref 35–70)
LDL Cholesterol: 98
Triglycerides: 62 (ref 40–160)

## 2018-06-02 LAB — CBC AND DIFFERENTIAL
HCT: 40 (ref 36–46)
HEMOGLOBIN: 13.8 (ref 12.0–16.0)
Platelets: 302 (ref 150–399)
WBC: 7.2

## 2018-06-02 LAB — BASIC METABOLIC PANEL
BUN: 16 (ref 4–21)
Creatinine: 1.3 — AB (ref 0.5–1.1)
Glucose: 106
Potassium: 3.7 (ref 3.4–5.3)
Sodium: 143 (ref 137–147)

## 2018-06-02 LAB — HEPATIC FUNCTION PANEL
ALT: 17 (ref 7–35)
AST: 40 — AB (ref 13–35)
Alkaline Phosphatase: 130 — AB (ref 25–125)
Bilirubin, Total: 0.5

## 2018-06-02 LAB — TSH: TSH: 1.54 (ref 0.41–5.90)

## 2018-06-12 ENCOUNTER — Ambulatory Visit: Payer: BLUE CROSS/BLUE SHIELD | Admitting: Physician Assistant

## 2018-06-12 ENCOUNTER — Encounter: Payer: Self-pay | Admitting: Physician Assistant

## 2018-06-12 VITALS — BP 134/89 | HR 61 | Ht 67.0 in | Wt 170.0 lb

## 2018-06-12 DIAGNOSIS — R030 Elevated blood-pressure reading, without diagnosis of hypertension: Secondary | ICD-10-CM | POA: Diagnosis not present

## 2018-06-12 DIAGNOSIS — N183 Chronic kidney disease, stage 3 unspecified: Secondary | ICD-10-CM

## 2018-06-12 DIAGNOSIS — Z23 Encounter for immunization: Secondary | ICD-10-CM | POA: Diagnosis not present

## 2018-06-12 DIAGNOSIS — E039 Hypothyroidism, unspecified: Secondary | ICD-10-CM | POA: Diagnosis not present

## 2018-06-12 DIAGNOSIS — E782 Mixed hyperlipidemia: Secondary | ICD-10-CM

## 2018-06-12 DIAGNOSIS — R600 Localized edema: Secondary | ICD-10-CM | POA: Diagnosis not present

## 2018-06-12 DIAGNOSIS — R7989 Other specified abnormal findings of blood chemistry: Secondary | ICD-10-CM

## 2018-06-12 DIAGNOSIS — R748 Abnormal levels of other serum enzymes: Secondary | ICD-10-CM

## 2018-06-12 MED ORDER — HYDROCHLOROTHIAZIDE 25 MG PO TABS: ORAL_TABLET | ORAL | 0 refills | Status: DC

## 2018-06-12 MED ORDER — ATORVASTATIN CALCIUM 40 MG PO TABS: 40.0000 mg | ORAL_TABLET | Freq: Every day | ORAL | 3 refills | Status: DC

## 2018-06-12 MED ORDER — LEVOTHYROXINE SODIUM 50 MCG PO TABS: 50.0000 ug | ORAL_TABLET | Freq: Every day | ORAL | 1 refills | Status: DC

## 2018-06-12 NOTE — Progress Notes (Signed)
Subjective:    Patient ID: Lucianne Lei, female    DOB: 04-16-1951, 67 y.o.   MRN: [id number]  HPI Patient is a 67 year old female with hypothyroidism, hyperlipidemia who presents to the clinic for follow-up.  She had labs done at another clinic on 06/02/2018 and she brings them in for review.  Overall patient is feeling pretty well today.  She denies any major concerns.  She did have one episode of very sudden sharp epigastric pain at night.  It happened once and has not happened again.  She denies any nausea or vomiting.  She denies any stool changes.  .. Active Ambulatory Problems    Diagnosis Date Noted  . Thyroid activity decreased 04/08/2015  . Bilateral lower extremity edema 04/08/2015  . CKD (chronic kidney disease) stage 3, GFR 30-59 ml/min (HCC) 04/10/2015  . Hyperlipidemia 04/10/2015  . Raynaud's disease without gangrene 05/04/2015  . No energy 05/04/2015  . Snoring 05/04/2015  . Sleep apnea 06/30/2015  . Dry mouth 10/20/2015  . Dry skin 10/20/2015  . Urinary incontinence in female 12/25/2015  . Chronic cystitis 04/15/2016  . Dysuria 04/15/2016  . Pure hypercholesterolemia 06/07/2017  . Acute appendicitis with suppurative peritonitis s/p lap appendectomy 06/08/2017 06/08/2017  . Arthritis of carpometacarpal (CMC) joint of both thumbs 10/23/2017  . Primary osteoarthritis of right knee 01/01/2018  . Colon adenoma 01/22/2018  . Elevated serum creatinine 06/15/2018  . Elevated blood pressure reading 06/15/2018  . Elevated liver enzymes 06/15/2018   Resolved Ambulatory Problems    Diagnosis Date Noted  . Weight gain 10/20/2015  . Abdominal discomfort 04/15/2016  . Leukocytes in urine 04/15/2016  . Right lower quadrant abdominal pain 06/07/2017  . Absent bowel sounds 06/07/2017  . Left lower quadrant pain 06/07/2017  . Abdominal guarding 06/07/2017  . Status post laparoscopic appendectomy 06/08/2017   Past Medical History:  Diagnosis Date  . Acute appendicitis   .  Thyroid disease       Review of Systems See HPI.     Objective:   Physical Exam  Constitutional: She is oriented to person, place, and time. She appears well-developed and well-nourished.  HENT:  Head: Normocephalic and atraumatic.  Cardiovascular: Normal rate and regular rhythm.  Pulmonary/Chest: Effort normal and breath sounds normal.  Neurological: She is alert and oriented to person, place, and time.  Psychiatric: She has a normal mood and affect. Her behavior is normal.          Assessment & Plan:  Marland KitchenMarland KitchenDiagnoses and all orders for this visit:  Acquired hypothyroidism -     levothyroxine (SYNTHROID, LEVOTHROID) 50 MCG tablet; Take 1 tablet (50 mcg total) by mouth daily before breakfast.  Mixed hyperlipidemia -     atorvastatin (LIPITOR) 40 MG tablet; Take 1 tablet (40 mg total) by mouth daily.  Bilateral lower extremity edema -     hydrochlorothiazide (HYDRODIURIL) 25 MG tablet; TAKE 1 TABLET BY MOUTH EVERY DAY  Elevated blood pressure reading  Elevated serum creatinine -     BASIC METABOLIC PANEL WITH GFR  Elevated liver enzymes -     BASIC METABOLIC PANEL WITH GFR  Need for pneumococcal vaccine -     Pneumococcal polysaccharide vaccine 23-valent greater than or equal to 2yo subcutaneous/IM  Need for shingles vaccine -     Varicella-zoster vaccine IM (Shingrix)   After EMR review creatine has actually improved from 1.38 to 1.28.  Liver enzymes worsen a bit going from ALT of 17-54 and GGT of 24-71 and  alkaline phosphatase of 97-130. TSH has improved from 4.27-1.5 LDL of 98, HDL of 72, triglycerides of 62.  Discussed likely patient is in some mild stage of chronic kidney disease however there was a small improvement in serum creatinine.  Continue to take diuretics only as needed and not daily.  Continue to stay hydrated.  We will continue to monitor this.  Continue to use any anti-inflammatories very sparingly.  Refilled Lipitor and levothyroxine for  medication management.  Liver enzymes are slightly concerning especially with the episode of sudden pain.  Could be evidence of some gallstones.  Follow-up if there is any more episodes of pain.  I would like to recheck CMP when she gets her second shingles shot.  If liver enzymes are still elevated we will consider the full work-up.  Discussed the most common reason for liver elevation is Tylenol use, alcohol use and fatty liver disease.  Encouraged her to keep to a low fat and processed food diet. It could also be lipitor increaseing enzymes as well. May consider a 2 week period off and see if any improvement.

## 2018-06-12 NOTE — Patient Instructions (Signed)
Get CMP in next 2-3 months.

## 2018-06-15 DIAGNOSIS — R748 Abnormal levels of other serum enzymes: Secondary | ICD-10-CM | POA: Insufficient documentation

## 2018-06-15 DIAGNOSIS — R030 Elevated blood-pressure reading, without diagnosis of hypertension: Secondary | ICD-10-CM | POA: Insufficient documentation

## 2018-06-15 DIAGNOSIS — R7989 Other specified abnormal findings of blood chemistry: Secondary | ICD-10-CM | POA: Insufficient documentation

## 2018-08-08 DIAGNOSIS — H9201 Otalgia, right ear: Secondary | ICD-10-CM | POA: Diagnosis not present

## 2018-08-09 ENCOUNTER — Encounter: Payer: Self-pay | Admitting: Physician Assistant

## 2018-08-13 ENCOUNTER — Ambulatory Visit (INDEPENDENT_AMBULATORY_CARE_PROVIDER_SITE_OTHER): Payer: BLUE CROSS/BLUE SHIELD | Admitting: Physician Assistant

## 2018-08-13 VITALS — Temp 98.1°F

## 2018-08-13 DIAGNOSIS — Z23 Encounter for immunization: Secondary | ICD-10-CM

## 2018-08-13 NOTE — Progress Notes (Signed)
Established Patient Office Visit  Subjective:  Patient ID: Susan Figueroa, female    DOB: 03-02-1951  Age: 68 y.o. MRN: [id number]  CC:  Chief Complaint  Patient presents with  . Immunizations    HPI [name] presents for last shingles vaccine.   Past Medical History:  Diagnosis Date  . Acute appendicitis   . Thyroid disease     Past Surgical History:  Procedure Laterality Date  . ABDOMINAL HYSTERECTOMY    . LAPAROSCOPIC APPENDECTOMY N/A 06/07/2017   Procedure: APPENDECTOMY LAPAROSCOPIC;  Surgeon: Johnathan Hausen, MD;  Location: [facility];  Service: General;  Laterality: N/A;    Family History  Problem Relation Age of Onset  . Cancer Mother        cervical  . Hypertension Brother   . Stroke Maternal Grandmother   . Heart attack Maternal Grandfather     Social History   Socioeconomic History  . Marital status: Married    Spouse name: Not on file  . Number of children: Not on file  . Years of education: Not on file  . Highest education level: Not on file  Occupational History  . Not on file  Social Needs  . Financial resource strain: Not on file  . Food insecurity:    Worry: Not on file    Inability: Not on file  . Transportation needs:    Medical: Not on file    Non-medical: Not on file  Tobacco Use  . Smoking status: Former Research scientist (life sciences)  . Smokeless tobacco: Never Used  Substance and Sexual Activity  . Alcohol use: No    Alcohol/week: 0.0 standard drinks  . Drug use: No  . Sexual activity: Yes  Lifestyle  . Physical activity:    Days per week: Not on file    Minutes per session: Not on file  . Stress: Not on file  Relationships  . Social connections:    Talks on phone: Not on file    Gets together: Not on file    Attends religious service: Not on file    Active member of club or organization: Not on file    Attends meetings of clubs or organizations: Not on file    Relationship status: Not on file  . Intimate partner violence:    Fear of current  or ex partner: Not on file    Emotionally abused: Not on file    Physically abused: Not on file    Forced sexual activity: Not on file  Other Topics Concern  . Not on file  Social History Narrative  . Not on file    Outpatient Medications Prior to Visit  Medication Sig Dispense Refill  . aspirin 81 MG tablet Take 81 mg by mouth daily.    [name] atorvastatin (LIPITOR) 40 MG tablet Take 1 tablet (40 mg total) by mouth daily. 90 tablet 3  . Cholecalciferol (VITAMIN D3) 2000 UNITS TABS Take 2,000 Int'l Units by mouth daily.    . diclofenac sodium (VOLTAREN) 1 % GEL Apply 4 g topically 4 (four) times daily. To affected joint. 100 g 5  . hydrochlorothiazide (HYDRODIURIL) 25 MG tablet TAKE 1 TABLET BY MOUTH EVERY DAY 90 tablet 0  . levothyroxine (SYNTHROID, LEVOTHROID) 50 MCG tablet Take 1 tablet (50 mcg total) by mouth daily before breakfast. 90 tablet 1  . Magnesium 500 MG CAPS Take 1 capsule daily by mouth.     . Potassium Chloride GRAN Take 1 capsule daily by mouth.  No facility-administered medications prior to visit.     No Known Allergies  ROS Review of Systems    Objective:    Physical Exam  Temp 98.1 F (36.7 C) (Oral)  Wt Readings from Last 3 Encounters:  06/12/18 170 lb (77.1 kg)  01/01/18 167 lb (75.8 kg)  10/23/17 164 lb (74.4 kg)     Health Maintenance Due  Topic Date Due  . DEXA SCAN  11/06/2017    There are no preventive care reminders to display for this patient.  Lab Results  Component Value Date   TSH 1.54 06/02/2018   Lab Results  Component Value Date   WBC 7.2 06/02/2018   HGB 13.8 06/02/2018   HCT 40 06/02/2018   MCV 87.7 06/07/2017   PLT 302 06/02/2018   Lab Results  Component Value Date   NA 143 06/02/2018   K 3.7 06/02/2018   CO2 25 06/08/2017   GLUCOSE 238 (H) 06/08/2017   BUN 16 06/02/2018   CREATININE 1.3 (A) 06/02/2018   BILITOT 0.5 06/07/2017   ALKPHOS 130 (A) 06/02/2018   AST 40 (A) 06/02/2018   ALT 17 06/02/2018   PROT  7.1 06/07/2017   ALBUMIN 3.7 06/07/2017   CALCIUM 8.7 (L) 06/08/2017   ANIONGAP 9 06/08/2017   Lab Results  Component Value Date   CHOL 182 06/02/2018   Lab Results  Component Value Date   HDL 72 (A) 06/02/2018   Lab Results  Component Value Date   LDLCALC 98 06/02/2018   Lab Results  Component Value Date   TRIG 62 06/02/2018   Lab Results  Component Value Date   CHOLHDL 4.5 04/08/2015   No results found for: HGBA1C    Assessment & Plan:  Need shingles vaccine - Patient tolerated injection well without complications.     Problem List Items Addressed This Visit    None    Visit Diagnoses    Need for shingles vaccine    -  Primary   Relevant Orders   Varicella-zoster vaccine IM (Shingrix)      No orders of the defined types were placed in this encounter.   Follow-up: Return if symptoms worsen or fail to improve. Follow up as directed by PCP.    Lavell Luster, CMA   Agree with above plan. Iran Planas PA-C

## 2019-01-29 ENCOUNTER — Other Ambulatory Visit: Payer: Self-pay | Admitting: Physician Assistant

## 2019-01-29 DIAGNOSIS — E039 Hypothyroidism, unspecified: Secondary | ICD-10-CM

## 2019-04-18 DIAGNOSIS — M79672 Pain in left foot: Secondary | ICD-10-CM | POA: Diagnosis not present

## 2019-04-18 DIAGNOSIS — M79671 Pain in right foot: Secondary | ICD-10-CM | POA: Diagnosis not present

## 2019-04-30 ENCOUNTER — Other Ambulatory Visit: Payer: Self-pay | Admitting: Physician Assistant

## 2019-04-30 DIAGNOSIS — E039 Hypothyroidism, unspecified: Secondary | ICD-10-CM

## 2019-05-06 ENCOUNTER — Telehealth: Payer: Self-pay | Admitting: Neurology

## 2019-05-06 NOTE — Telephone Encounter (Signed)
Patient called to see if immunizations were up to date. It looks like all immunizations are completed and nothing due. Patient made aware.

## 2019-05-30 DIAGNOSIS — M79672 Pain in left foot: Secondary | ICD-10-CM | POA: Diagnosis not present

## 2019-05-30 DIAGNOSIS — M79671 Pain in right foot: Secondary | ICD-10-CM | POA: Diagnosis not present

## 2019-07-20 ENCOUNTER — Other Ambulatory Visit: Payer: Self-pay | Admitting: Physician Assistant

## 2019-07-20 DIAGNOSIS — E782 Mixed hyperlipidemia: Secondary | ICD-10-CM

## 2019-08-03 ENCOUNTER — Other Ambulatory Visit: Payer: Self-pay | Admitting: Physician Assistant

## 2019-08-03 DIAGNOSIS — E039 Hypothyroidism, unspecified: Secondary | ICD-10-CM

## 2019-08-13 ENCOUNTER — Other Ambulatory Visit: Payer: Self-pay | Admitting: Physician Assistant

## 2019-08-13 DIAGNOSIS — E782 Mixed hyperlipidemia: Secondary | ICD-10-CM

## 2019-08-14 ENCOUNTER — Telehealth: Payer: Self-pay | Admitting: Physician Assistant

## 2019-08-14 DIAGNOSIS — Z13820 Encounter for screening for osteoporosis: Secondary | ICD-10-CM

## 2019-08-14 DIAGNOSIS — Z1231 Encounter for screening mammogram for malignant neoplasm of breast: Secondary | ICD-10-CM

## 2019-08-14 DIAGNOSIS — Z780 Asymptomatic menopausal state: Secondary | ICD-10-CM

## 2019-08-14 NOTE — Telephone Encounter (Signed)
Patient made aware and is okay with going downstairs. She also wanted a MGM ordered. Order placed for MGM and DEXA. Aware they will call her to schedule. She will call us back if she does not hear from them.

## 2019-08-14 NOTE — Telephone Encounter (Signed)
She will need an order. Does she want to go downstairs or somewhere else.

## 2019-08-14 NOTE — Telephone Encounter (Signed)
Patient wanted to know if a referral was needed for a bone density or if she can call to make an appointment. This was needed per her work place. Please advise.

## 2019-08-26 ENCOUNTER — Other Ambulatory Visit: Payer: Self-pay | Admitting: Physician Assistant

## 2019-08-26 DIAGNOSIS — R600 Localized edema: Secondary | ICD-10-CM

## 2019-08-28 ENCOUNTER — Other Ambulatory Visit: Payer: Self-pay

## 2019-08-28 ENCOUNTER — Other Ambulatory Visit: Payer: Self-pay | Admitting: Physician Assistant

## 2019-08-28 ENCOUNTER — Ambulatory Visit (INDEPENDENT_AMBULATORY_CARE_PROVIDER_SITE_OTHER): Payer: BC Managed Care – PPO

## 2019-08-28 DIAGNOSIS — R600 Localized edema: Secondary | ICD-10-CM

## 2019-08-28 DIAGNOSIS — Z13820 Encounter for screening for osteoporosis: Secondary | ICD-10-CM | POA: Diagnosis not present

## 2019-08-28 DIAGNOSIS — Z1231 Encounter for screening mammogram for malignant neoplasm of breast: Secondary | ICD-10-CM | POA: Diagnosis not present

## 2019-08-28 DIAGNOSIS — Z780 Asymptomatic menopausal state: Secondary | ICD-10-CM | POA: Diagnosis not present

## 2019-08-28 DIAGNOSIS — M8589 Other specified disorders of bone density and structure, multiple sites: Secondary | ICD-10-CM | POA: Diagnosis not present

## 2019-08-29 NOTE — Progress Notes (Signed)
Call pt:   Bone density shows osteopenia, low bone mass. Recheck in 2 years. Take vitamin D and calcium daily.

## 2019-08-30 NOTE — Progress Notes (Signed)
Normal mammogram follow up in 1 year.

## 2019-09-05 ENCOUNTER — Other Ambulatory Visit: Payer: Self-pay | Admitting: Physician Assistant

## 2019-09-05 DIAGNOSIS — E782 Mixed hyperlipidemia: Secondary | ICD-10-CM

## 2019-09-06 ENCOUNTER — Other Ambulatory Visit: Payer: Self-pay | Admitting: Physician Assistant

## 2019-09-06 DIAGNOSIS — E039 Hypothyroidism, unspecified: Secondary | ICD-10-CM

## 2019-09-22 ENCOUNTER — Other Ambulatory Visit: Payer: Self-pay | Admitting: Physician Assistant

## 2019-09-22 DIAGNOSIS — R600 Localized edema: Secondary | ICD-10-CM

## 2019-09-25 ENCOUNTER — Encounter: Payer: Self-pay | Admitting: Physician Assistant

## 2019-09-25 ENCOUNTER — Ambulatory Visit (INDEPENDENT_AMBULATORY_CARE_PROVIDER_SITE_OTHER): Payer: BC Managed Care – PPO | Admitting: Physician Assistant

## 2019-09-25 ENCOUNTER — Other Ambulatory Visit: Payer: Self-pay

## 2019-09-25 VITALS — BP 132/84 | HR 64 | Ht 67.0 in | Wt 187.0 lb

## 2019-09-25 DIAGNOSIS — Z23 Encounter for immunization: Secondary | ICD-10-CM

## 2019-09-25 DIAGNOSIS — E039 Hypothyroidism, unspecified: Secondary | ICD-10-CM | POA: Diagnosis not present

## 2019-09-25 DIAGNOSIS — Z131 Encounter for screening for diabetes mellitus: Secondary | ICD-10-CM | POA: Diagnosis not present

## 2019-09-25 DIAGNOSIS — M8580 Other specified disorders of bone density and structure, unspecified site: Secondary | ICD-10-CM | POA: Insufficient documentation

## 2019-09-25 DIAGNOSIS — Z13220 Encounter for screening for lipoid disorders: Secondary | ICD-10-CM

## 2019-09-25 DIAGNOSIS — Z0000 Encounter for general adult medical examination without abnormal findings: Secondary | ICD-10-CM | POA: Diagnosis not present

## 2019-09-25 NOTE — Patient Instructions (Signed)
Health Maintenance After Age 67 After age 67, you are at a higher risk for certain long-term diseases and infections as well as injuries from falls. Falls are a major cause of broken bones and head injuries in people who are older than age 67. Getting regular preventive care can help to keep you healthy and well. Preventive care includes getting regular testing and making lifestyle changes as recommended by your health care provider. Talk with your health care provider about:  Which screenings and tests you should have. A screening is a test that checks for a disease when you have no symptoms.  A diet and exercise plan that is right for you. What should I know about screenings and tests to prevent falls? Screening and testing are the best ways to find a health problem early. Early diagnosis and treatment give you the best chance of managing medical conditions that are common after age 67. Certain conditions and lifestyle choices may make you more likely to have a fall. Your health care provider may recommend:  Regular vision checks. Poor vision and conditions such as cataracts can make you more likely to have a fall. If you wear glasses, make sure to get your prescription updated if your vision changes.  Medicine review. Work with your health care provider to regularly review all of the medicines you are taking, including over-the-counter medicines. Ask your health care provider about any side effects that may make you more likely to have a fall. Tell your health care provider if any medicines that you take make you feel dizzy or sleepy.  Osteoporosis screening. Osteoporosis is a condition that causes the bones to get weaker. This can make the bones weak and cause them to break more easily.  Blood pressure screening. Blood pressure changes and medicines to control blood pressure can make you feel dizzy.  Strength and balance checks. Your health care provider may recommend certain tests to check your  strength and balance while standing, walking, or changing positions.  Foot health exam. Foot pain and numbness, as well as not wearing proper footwear, can make you more likely to have a fall.  Depression screening. You may be more likely to have a fall if you have a fear of falling, feel emotionally low, or feel unable to do activities that you used to do.  Alcohol use screening. Using too much alcohol can affect your balance and may make you more likely to have a fall. What actions can I take to lower my risk of falls? General instructions  Talk with your health care provider about your risks for falling. Tell your health care provider if: ? You fall. Be sure to tell your health care provider about all falls, even ones that seem minor. ? You feel dizzy, sleepy, or off-balance.  Take over-the-counter and prescription medicines only as told by your health care provider. These include any supplements.  Eat a healthy diet and maintain a healthy weight. A healthy diet includes low-fat dairy products, low-fat (lean) meats, and fiber from whole grains, beans, and lots of fruits and vegetables. Home safety  Remove any tripping hazards, such as rugs, cords, and clutter.  Install safety equipment such as grab bars in bathrooms and safety rails on stairs.  Keep rooms and walkways well-lit. Activity   Follow a regular exercise program to stay fit. This will help you maintain your balance. Ask your health care provider what types of exercise are appropriate for you.  If you need a cane or   walker, use it as recommended by your health care provider.  Wear supportive shoes that have nonskid soles. Lifestyle  Do not drink alcohol if your health care provider tells you not to drink.  If you drink alcohol, limit how much you have: ? 0-1 drink a day for women. ? 0-2 drinks a day for men.  Be aware of how much alcohol is in your drink. In the [place] one drink equals one typical bottle of beer (12  oz), one-half glass of wine (5 oz), or one shot of hard liquor (1 oz).  Do not use any products that contain nicotine or tobacco, such as cigarettes and e-cigarettes. If you need help quitting, ask your health care provider. Summary  Having a healthy lifestyle and getting preventive care can help to protect your health and wellness after age 67.  Screening and testing are the best way to find a health problem early and help you avoid having a fall. Early diagnosis and treatment give you the best chance for managing medical conditions that are more common for people who are older than age 67.  Falls are a major cause of broken bones and head injuries in people who are older than age 67. Take precautions to prevent a fall at home.  Work with your health care provider to learn what changes you can make to improve your health and wellness and to prevent falls. This information is not intended to replace advice given to you by your health care provider. Make sure you discuss any questions you have with your health care provider. Document Revised: 11/08/2018 Document Reviewed: 05/31/2017 Elsevier Patient Education  2020 Elsevier Inc.  

## 2019-09-27 ENCOUNTER — Telehealth: Payer: Self-pay | Admitting: Physician Assistant

## 2019-09-27 NOTE — Progress Notes (Signed)
Subjective:     Susan Figueroa is a 69 y.o. female and is here for a comprehensive physical exam. The patient reports no problems.  Social History   Socioeconomic History  . Marital status: Married    Spouse name: Not on file  . Number of children: Not on file  . Years of education: Not on file  . Highest education level: Not on file  Occupational History  . Not on file  Tobacco Use  . Smoking status: Former Research scientist (life sciences)  . Smokeless tobacco: Never Used  Substance and Sexual Activity  . Alcohol use: No    Alcohol/week: 0.0 standard drinks  . Drug use: No  . Sexual activity: Yes  Other Topics Concern  . Not on file  Social History Narrative  . Not on file   Social Determinants of Health   Financial Resource Strain:   . Difficulty of Paying Living Expenses: Not on file  Food Insecurity:   . Worried About Charity fundraiser in the Last Year: Not on file  . Ran Out of Food in the Last Year: Not on file  Transportation Needs:   . Lack of Transportation (Medical): Not on file  . Lack of Transportation (Non-Medical): Not on file  Physical Activity:   . Days of Exercise per Week: Not on file  . Minutes of Exercise per Session: Not on file  Stress:   . Feeling of Stress : Not on file  Social Connections:   . Frequency of Communication with Friends and Family: Not on file  . Frequency of Social Gatherings with Friends and Family: Not on file  . Attends Religious Services: Not on file  . Active Member of Clubs or Organizations: Not on file  . Attends Archivist Meetings: Not on file  . Marital Status: Not on file  Intimate Partner Violence:   . Fear of Current or Ex-Partner: Not on file  . Emotionally Abused: Not on file  . Physically Abused: Not on file  . Sexually Abused: Not on file   Health Maintenance  Topic Date Due  . COLONOSCOPY  01/22/2019  . PNA vac Low Risk Adult (2 of 2 - PCV13) 09/24/2020  . MAMMOGRAM  08/27/2021  . TETANUS/TDAP  04/07/2025  .  INFLUENZA VACCINE  Completed  . DEXA SCAN  Completed  . Hepatitis C Screening  Completed    The following portions of the patient's history were reviewed and updated as appropriate: allergies, current medications, past family history, past medical history, past social history, past surgical history and problem list.  Review of Systems A comprehensive review of systems was negative.   Objective:    BP 132/84   Pulse 64   Ht 5\' 7"  (1.702 m)   Wt 187 lb (84.8 kg)   SpO2 100%   BMI 29.29 kg/m  General appearance: alert, cooperative and appears stated age Head: Normocephalic, without obvious abnormality, atraumatic Eyes: conjunctivae/corneas clear. PERRL, EOM's intact. Fundi benign. Ears: normal TM's and external ear canals both ears Nose: Nares normal. Septum midline. Mucosa normal. No drainage or sinus tenderness. Throat: lips, mucosa, and tongue normal; teeth and gums normal Neck: no adenopathy, no carotid bruit, no JVD, supple, symmetrical, trachea midline and thyroid not enlarged, symmetric, no tenderness/mass/nodules Back: symmetric, no curvature. ROM normal. No CVA tenderness. Lungs: clear to auscultation bilaterally Heart: regular rate and rhythm, S1, S2 normal, no murmur, click, rub or gallop Abdomen: soft, non-tender; bowel sounds normal; no masses,  no organomegaly Extremities:  extremities normal, atraumatic, no cyanosis or edema Pulses: 2+ and symmetric Skin: Skin color, texture, turgor normal. No rashes or lesions Neurologic: Alert and oriented X 3, normal strength and tone. Normal symmetric reflexes. Normal coordination and gait   .[name] Depression screen [facility] 2/9 09/27/2019 10/23/2017  Decreased Interest 0 0  Down, Depressed, Hopeless 0 0  PHQ - 2 Score 0 0  Altered sleeping 0 -  Tired, decreased energy 0 -  Change in appetite 0 -  Feeling bad or failure about yourself  0 -  Trouble concentrating 0 -  Moving slowly or fidgety/restless 0 -  Suicidal thoughts 0 -  PHQ-9  Score 0 -  Difficult doing work/chores Not difficult at all -   . Assessment:    Healthy female exam.      Plan:    Marland KitchenMarland KitchenDiagnoses and all orders for this visit:  Routine physical examination  Screening for lipid disorders -     Lipid Panel w/reflex Direct LDL  Screening for diabetes mellitus -     COMPLETE METABOLIC PANEL WITH GFR  Hypothyroidism, unspecified type -     TSH  Osteopenia, unspecified location  Need for pneumococcal vaccination -     Pneumococcal polysaccharide vaccine 23-valent greater than or equal to 2yo subcutaneous/IM   .[name] Discussed 150 minutes of exercise a week.  Encouraged vitamin D 1000 units and Calcium 1300mg  or 4 servings of dairy a day.  Fasting labs ordered.  No need for pap due to age.  Mammogram UTD.  Colonoscopy UTD per patient need copy of report.  Bone density discussed osteopenia and taking vitamin D and calcium. Recheck in 2 years.  shingrix done.  Flu done.  Pneumonia 23 given today.  See After Visit Summary for Counseling Recommendations

## 2019-09-27 NOTE — Telephone Encounter (Signed)
Can we call for colonoscopy for patient.

## 2019-09-27 NOTE — Telephone Encounter (Signed)
Request sent to [facility].

## 2019-10-02 ENCOUNTER — Other Ambulatory Visit: Payer: Self-pay | Admitting: Physician Assistant

## 2019-10-02 DIAGNOSIS — R600 Localized edema: Secondary | ICD-10-CM

## 2019-10-05 ENCOUNTER — Other Ambulatory Visit: Payer: Self-pay | Admitting: Physician Assistant

## 2019-10-05 DIAGNOSIS — E039 Hypothyroidism, unspecified: Secondary | ICD-10-CM

## 2019-10-17 ENCOUNTER — Other Ambulatory Visit: Payer: Self-pay | Admitting: Physician Assistant

## 2019-10-17 DIAGNOSIS — E782 Mixed hyperlipidemia: Secondary | ICD-10-CM

## 2019-11-01 ENCOUNTER — Other Ambulatory Visit: Payer: Self-pay | Admitting: Physician Assistant

## 2019-11-01 DIAGNOSIS — E039 Hypothyroidism, unspecified: Secondary | ICD-10-CM

## 2019-11-14 DIAGNOSIS — M79671 Pain in right foot: Secondary | ICD-10-CM | POA: Diagnosis not present

## 2019-11-14 DIAGNOSIS — M79672 Pain in left foot: Secondary | ICD-10-CM | POA: Diagnosis not present

## 2019-11-19 ENCOUNTER — Ambulatory Visit: Payer: BLUE CROSS/BLUE SHIELD | Admitting: Physician Assistant

## 2019-12-26 ENCOUNTER — Other Ambulatory Visit: Payer: Self-pay | Admitting: Physician Assistant

## 2019-12-26 DIAGNOSIS — E782 Mixed hyperlipidemia: Secondary | ICD-10-CM

## 2019-12-26 DIAGNOSIS — E039 Hypothyroidism, unspecified: Secondary | ICD-10-CM

## 2019-12-26 DIAGNOSIS — R600 Localized edema: Secondary | ICD-10-CM

## 2019-12-26 MED ORDER — LEVOTHYROXINE SODIUM 50 MCG PO TABS: 50.0000 ug | ORAL_TABLET | Freq: Every day | ORAL | 0 refills | Status: DC

## 2019-12-26 NOTE — Addendum Note (Signed)
Addended by: Towana Badger on: 12/26/2019 03:49 PM   Modules accepted: Orders

## 2019-12-27 DIAGNOSIS — E039 Hypothyroidism, unspecified: Secondary | ICD-10-CM | POA: Diagnosis not present

## 2019-12-27 DIAGNOSIS — Z13220 Encounter for screening for lipoid disorders: Secondary | ICD-10-CM | POA: Diagnosis not present

## 2019-12-27 DIAGNOSIS — Z131 Encounter for screening for diabetes mellitus: Secondary | ICD-10-CM | POA: Diagnosis not present

## 2019-12-28 LAB — COMPLETE METABOLIC PANEL WITH GFR
AG Ratio: 1.6 (calc) (ref 1.0–2.5)
ALT: 20 U/L (ref 6–29)
AST: 21 U/L (ref 10–35)
Albumin: 4.1 g/dL (ref 3.6–5.1)
Alkaline phosphatase (APISO): 90 U/L (ref 37–153)
BUN/Creatinine Ratio: 15 (calc) (ref 6–22)
BUN: 20 mg/dL (ref 7–25)
CO2: 27 mmol/L (ref 20–32)
Calcium: 9.8 mg/dL (ref 8.6–10.4)
Chloride: 104 mmol/L (ref 98–110)
Creat: 1.35 mg/dL — ABNORMAL HIGH (ref 0.50–0.99)
GFR, Est African American: 47 mL/min/{1.73_m2} — ABNORMAL LOW (ref >=60)
GFR, Est Non African American: 41 mL/min/{1.73_m2} — ABNORMAL LOW (ref >=60)
Globulin: 2.6 g/dL (calc) (ref 1.9–3.7)
Glucose, Bld: 99 mg/dL (ref 65–99)
Potassium: 3.5 mmol/L (ref 3.5–5.3)
Sodium: 139 mmol/L (ref 135–146)
Total Bilirubin: 0.5 mg/dL (ref 0.2–1.2)
Total Protein: 6.7 g/dL (ref 6.1–8.1)

## 2019-12-28 LAB — LIPID PANEL W/REFLEX DIRECT LDL
Cholesterol: 176 mg/dL (ref <200)
HDL: 68 mg/dL (ref >=50)
LDL Cholesterol (Calc): 88 mg/dL (calc)
Non-HDL Cholesterol (Calc): 108 mg/dL (calc) (ref <130)
Total CHOL/HDL Ratio: 2.6 (calc) (ref <5.0)
Triglycerides: 103 mg/dL (ref <150)

## 2019-12-28 LAB — TSH: TSH: 1.84 mIU/L (ref 0.40–4.50)

## 2019-12-31 ENCOUNTER — Ambulatory Visit (INDEPENDENT_AMBULATORY_CARE_PROVIDER_SITE_OTHER): Payer: BC Managed Care – PPO | Admitting: Physician Assistant

## 2019-12-31 ENCOUNTER — Encounter: Payer: Self-pay | Admitting: Physician Assistant

## 2019-12-31 ENCOUNTER — Other Ambulatory Visit: Payer: Self-pay

## 2019-12-31 VITALS — BP 134/82 | HR 62 | Ht 67.0 in | Wt 180.0 lb

## 2019-12-31 DIAGNOSIS — R600 Localized edema: Secondary | ICD-10-CM | POA: Diagnosis not present

## 2019-12-31 DIAGNOSIS — E039 Hypothyroidism, unspecified: Secondary | ICD-10-CM | POA: Diagnosis not present

## 2019-12-31 DIAGNOSIS — E782 Mixed hyperlipidemia: Secondary | ICD-10-CM | POA: Diagnosis not present

## 2019-12-31 DIAGNOSIS — N1832 Chronic kidney disease, stage 3b: Secondary | ICD-10-CM

## 2019-12-31 MED ORDER — ATORVASTATIN CALCIUM 40 MG PO TABS: 40.0000 mg | ORAL_TABLET | Freq: Every day | ORAL | 3 refills | Status: DC

## 2019-12-31 MED ORDER — HYDROCHLOROTHIAZIDE 25 MG PO TABS: ORAL_TABLET | ORAL | 3 refills | Status: DC

## 2019-12-31 MED ORDER — LEVOTHYROXINE SODIUM 50 MCG PO TABS: 50.0000 ug | ORAL_TABLET | Freq: Every day | ORAL | 3 refills | Status: DC

## 2019-12-31 NOTE — Progress Notes (Signed)
Reviewed with patient at visit today.

## 2019-12-31 NOTE — Progress Notes (Signed)
° °  Subjective:    Patient ID: Susan Figueroa, female    DOB: 10-Jan-1951, 69 y.o.   MRN: [id number]  HPI Pt is a 69 yo female with hypothyroidism, chronic lower leg edema, hyperlipidemia who presents to the clinic for medication refill.   She had CPE in 09/2019.   She is doing great. With no concerns. She is staying active walking. She denies any SI/HC. She is taking her medication daily with no problems. She has plenty of energy. Her bowel movements are great. No concerning aches or pains.   She needs refills. She just had labs.   .. Active Ambulatory Problems    Diagnosis Date Noted   Thyroid activity decreased 04/08/2015   Bilateral lower extremity edema 04/08/2015   CKD (chronic kidney disease) stage 3, GFR 30-59 ml/min (HCC) 04/10/2015   Hyperlipidemia 04/10/2015   Raynaud's disease without gangrene 05/04/2015   No energy 05/04/2015   Snoring 05/04/2015   Sleep apnea 06/30/2015   Dry mouth 10/20/2015   Dry skin 10/20/2015   Urinary incontinence in female 12/25/2015   Chronic cystitis 04/15/2016   Dysuria 04/15/2016   Pure hypercholesterolemia 06/07/2017   Acute appendicitis with suppurative peritonitis s/p lap appendectomy 06/08/2017 06/08/2017   Arthritis of carpometacarpal (CMC) joint of both thumbs 10/23/2017   Primary osteoarthritis of right knee 01/01/2018   Colon adenoma 01/22/2018   Elevated serum creatinine 06/15/2018   Elevated blood pressure reading 06/15/2018   Elevated liver enzymes 06/15/2018   Osteopenia 09/25/2019   Resolved Ambulatory Problems    Diagnosis Date Noted   Weight gain 10/20/2015   Abdominal discomfort 04/15/2016   Leukocytes in urine 04/15/2016   Right lower quadrant abdominal pain 06/07/2017   Absent bowel sounds 06/07/2017   Left lower quadrant pain 06/07/2017   Abdominal guarding 06/07/2017   Status post laparoscopic appendectomy 06/08/2017   Past Medical History:  Diagnosis Date   Acute appendicitis     Thyroid disease       Review of Systems  All other systems reviewed and are negative.      Objective:   Physical Exam Vitals reviewed.  Constitutional:      Appearance: Normal appearance.  Neck:     Vascular: No carotid bruit.  Cardiovascular:     Rate and Rhythm: Normal rate and regular rhythm.     Pulses: Normal pulses.  Pulmonary:     Effort: Pulmonary effort is normal.     Breath sounds: Normal breath sounds.  Neurological:     General: No focal deficit present.     Mental Status: She is alert and oriented to person, place, and time.  Psychiatric:        Mood and Affect: Mood normal.           Assessment & Plan:  Marland KitchenMarland KitchenJolani was seen today for hypertension and hypothyroidism.  Diagnoses and all orders for this visit:  Acquired hypothyroidism -     levothyroxine (SYNTHROID) 50 MCG tablet; Take 1 tablet (50 mcg total) by mouth daily before breakfast.  Bilateral lower extremity edema -     hydrochlorothiazide (HYDRODIURIL) 25 MG tablet; TAKE 1 TABLET BY MOUTH EVERY DAY  Mixed hyperlipidemia -     atorvastatin (LIPITOR) 40 MG tablet; Take 1 tablet (40 mg total) by mouth daily.  Stage 3b chronic kidney disease   Labs look great. Refilled medications for 1 year.  Pt had colonoscopy will get report and abstract.  Vaccines UTD.

## 2020-05-25 DIAGNOSIS — H2513 Age-related nuclear cataract, bilateral: Secondary | ICD-10-CM | POA: Diagnosis not present

## 2020-05-25 DIAGNOSIS — H04123 Dry eye syndrome of bilateral lacrimal glands: Secondary | ICD-10-CM | POA: Diagnosis not present

## 2020-05-25 DIAGNOSIS — H35371 Puckering of macula, right eye: Secondary | ICD-10-CM | POA: Diagnosis not present

## 2020-05-25 DIAGNOSIS — H35341 Macular cyst, hole, or pseudohole, right eye: Secondary | ICD-10-CM | POA: Diagnosis not present

## 2020-06-30 DIAGNOSIS — H2513 Age-related nuclear cataract, bilateral: Secondary | ICD-10-CM | POA: Diagnosis not present

## 2020-06-30 DIAGNOSIS — H04123 Dry eye syndrome of bilateral lacrimal glands: Secondary | ICD-10-CM | POA: Diagnosis not present

## 2020-06-30 DIAGNOSIS — H35371 Puckering of macula, right eye: Secondary | ICD-10-CM | POA: Diagnosis not present

## 2020-06-30 DIAGNOSIS — H35341 Macular cyst, hole, or pseudohole, right eye: Secondary | ICD-10-CM | POA: Diagnosis not present

## 2020-06-30 DIAGNOSIS — H2511 Age-related nuclear cataract, right eye: Secondary | ICD-10-CM | POA: Diagnosis not present

## 2020-08-11 DIAGNOSIS — H2511 Age-related nuclear cataract, right eye: Secondary | ICD-10-CM | POA: Diagnosis not present

## 2020-08-11 DIAGNOSIS — H25043 Posterior subcapsular polar age-related cataract, bilateral: Secondary | ICD-10-CM | POA: Diagnosis not present

## 2020-08-11 DIAGNOSIS — H43393 Other vitreous opacities, bilateral: Secondary | ICD-10-CM | POA: Diagnosis not present

## 2020-08-11 DIAGNOSIS — H25041 Posterior subcapsular polar age-related cataract, right eye: Secondary | ICD-10-CM | POA: Diagnosis not present

## 2020-08-11 DIAGNOSIS — H2513 Age-related nuclear cataract, bilateral: Secondary | ICD-10-CM | POA: Diagnosis not present

## 2020-08-11 DIAGNOSIS — H5371 Glare sensitivity: Secondary | ICD-10-CM | POA: Diagnosis not present

## 2020-08-12 ENCOUNTER — Other Ambulatory Visit: Payer: Self-pay | Admitting: Physician Assistant

## 2020-08-12 DIAGNOSIS — Z1231 Encounter for screening mammogram for malignant neoplasm of breast: Secondary | ICD-10-CM

## 2020-09-21 DIAGNOSIS — H35341 Macular cyst, hole, or pseudohole, right eye: Secondary | ICD-10-CM | POA: Diagnosis not present

## 2020-09-21 DIAGNOSIS — H35371 Puckering of macula, right eye: Secondary | ICD-10-CM | POA: Diagnosis not present

## 2020-09-30 DIAGNOSIS — H25811 Combined forms of age-related cataract, right eye: Secondary | ICD-10-CM | POA: Diagnosis not present

## 2020-09-30 DIAGNOSIS — H52221 Regular astigmatism, right eye: Secondary | ICD-10-CM | POA: Diagnosis not present

## 2020-09-30 DIAGNOSIS — H35341 Macular cyst, hole, or pseudohole, right eye: Secondary | ICD-10-CM | POA: Diagnosis not present

## 2020-09-30 DIAGNOSIS — H2511 Age-related nuclear cataract, right eye: Secondary | ICD-10-CM | POA: Diagnosis not present

## 2020-09-30 DIAGNOSIS — H25041 Posterior subcapsular polar age-related cataract, right eye: Secondary | ICD-10-CM | POA: Diagnosis not present

## 2020-10-19 ENCOUNTER — Encounter: Payer: BC Managed Care – PPO | Admitting: Physician Assistant

## 2020-10-21 ENCOUNTER — Ambulatory Visit: Payer: BC Managed Care – PPO

## 2020-10-21 ENCOUNTER — Encounter: Payer: BC Managed Care – PPO | Admitting: Physician Assistant

## 2020-10-27 DIAGNOSIS — H2512 Age-related nuclear cataract, left eye: Secondary | ICD-10-CM | POA: Diagnosis not present

## 2020-10-27 DIAGNOSIS — H25042 Posterior subcapsular polar age-related cataract, left eye: Secondary | ICD-10-CM | POA: Diagnosis not present

## 2020-11-04 DIAGNOSIS — H25042 Posterior subcapsular polar age-related cataract, left eye: Secondary | ICD-10-CM | POA: Diagnosis not present

## 2020-11-04 DIAGNOSIS — H2512 Age-related nuclear cataract, left eye: Secondary | ICD-10-CM | POA: Diagnosis not present

## 2020-11-04 LAB — LIPID PANEL
Cholesterol: 205 — AB (ref 0–200)
HDL: 61 (ref 35–70)
LDL Cholesterol: 126
Triglycerides: 103 (ref 40–160)

## 2020-11-04 LAB — BASIC METABOLIC PANEL: Glucose: 95

## 2020-11-04 LAB — TSH: TSH: 2.71 (ref 0.41–5.90)

## 2020-12-08 ENCOUNTER — Encounter: Payer: BC Managed Care – PPO | Admitting: Physician Assistant

## 2020-12-09 DIAGNOSIS — B9689 Other specified bacterial agents as the cause of diseases classified elsewhere: Secondary | ICD-10-CM | POA: Diagnosis not present

## 2020-12-09 DIAGNOSIS — J40 Bronchitis, not specified as acute or chronic: Secondary | ICD-10-CM | POA: Diagnosis not present

## 2020-12-09 DIAGNOSIS — J019 Acute sinusitis, unspecified: Secondary | ICD-10-CM | POA: Diagnosis not present

## 2020-12-09 DIAGNOSIS — R062 Wheezing: Secondary | ICD-10-CM | POA: Diagnosis not present

## 2020-12-15 DIAGNOSIS — R062 Wheezing: Secondary | ICD-10-CM | POA: Diagnosis not present

## 2020-12-15 DIAGNOSIS — B9689 Other specified bacterial agents as the cause of diseases classified elsewhere: Secondary | ICD-10-CM | POA: Diagnosis not present

## 2020-12-15 DIAGNOSIS — J019 Acute sinusitis, unspecified: Secondary | ICD-10-CM | POA: Diagnosis not present

## 2020-12-15 DIAGNOSIS — J40 Bronchitis, not specified as acute or chronic: Secondary | ICD-10-CM | POA: Diagnosis not present

## 2020-12-16 ENCOUNTER — Ambulatory Visit: Payer: BC Managed Care – PPO

## 2020-12-30 ENCOUNTER — Other Ambulatory Visit: Payer: Self-pay

## 2020-12-30 ENCOUNTER — Ambulatory Visit (INDEPENDENT_AMBULATORY_CARE_PROVIDER_SITE_OTHER): Payer: BC Managed Care – PPO

## 2020-12-30 DIAGNOSIS — Z1231 Encounter for screening mammogram for malignant neoplasm of breast: Secondary | ICD-10-CM

## 2021-01-04 NOTE — Progress Notes (Signed)
Ramanda,   Mammogram normal. Follow up in 1 year.

## 2021-01-15 ENCOUNTER — Encounter: Payer: BC Managed Care – PPO | Admitting: Physician Assistant

## 2021-01-28 ENCOUNTER — Other Ambulatory Visit: Payer: Self-pay | Admitting: Physician Assistant

## 2021-01-28 DIAGNOSIS — R600 Localized edema: Secondary | ICD-10-CM

## 2021-01-28 DIAGNOSIS — E039 Hypothyroidism, unspecified: Secondary | ICD-10-CM

## 2021-02-09 ENCOUNTER — Other Ambulatory Visit: Payer: Self-pay

## 2021-02-09 ENCOUNTER — Ambulatory Visit (INDEPENDENT_AMBULATORY_CARE_PROVIDER_SITE_OTHER): Payer: BC Managed Care – PPO | Admitting: Physician Assistant

## 2021-02-09 VITALS — BP 120/72 | HR 72 | Ht 67.0 in | Wt 175.0 lb

## 2021-02-09 DIAGNOSIS — E782 Mixed hyperlipidemia: Secondary | ICD-10-CM

## 2021-02-09 DIAGNOSIS — Z0000 Encounter for general adult medical examination without abnormal findings: Secondary | ICD-10-CM

## 2021-02-09 DIAGNOSIS — E039 Hypothyroidism, unspecified: Secondary | ICD-10-CM | POA: Diagnosis not present

## 2021-02-09 DIAGNOSIS — Z23 Encounter for immunization: Secondary | ICD-10-CM | POA: Diagnosis not present

## 2021-02-09 DIAGNOSIS — N1832 Chronic kidney disease, stage 3b: Secondary | ICD-10-CM

## 2021-02-09 DIAGNOSIS — M8580 Other specified disorders of bone density and structure, unspecified site: Secondary | ICD-10-CM

## 2021-02-09 DIAGNOSIS — R600 Localized edema: Secondary | ICD-10-CM

## 2021-02-09 DIAGNOSIS — Z131 Encounter for screening for diabetes mellitus: Secondary | ICD-10-CM

## 2021-02-09 MED ORDER — HYDROCHLOROTHIAZIDE 25 MG PO TABS: ORAL_TABLET | ORAL | 3 refills | Status: DC

## 2021-02-09 MED ORDER — LEVOTHYROXINE SODIUM 50 MCG PO TABS
50.0000 ug | ORAL_TABLET | Freq: Every day | ORAL | 3 refills | Status: DC
Start: 2021-02-09 — End: 2022-04-05

## 2021-02-09 NOTE — Progress Notes (Signed)
Subjective:     Susan Figueroa is a 70 y.o. female and is here for a comprehensive physical exam. The patient reports problems - only issue with overactive bladder and leakage .  Social History   Socioeconomic History   Marital status: Married    Spouse name: Not on file   Number of children: Not on file   Years of education: Not on file   Highest education level: Not on file  Occupational History   Not on file  Tobacco Use   Smoking status: Former    Pack years: 0.00   Smokeless tobacco: Never  Substance and Sexual Activity   Alcohol use: No    Alcohol/week: 0.0 standard drinks   Drug use: No   Sexual activity: Yes  Other Topics Concern   Not on file  Social History Narrative   Not on file   Social Determinants of Health   Financial Resource Strain: Not on file  Food Insecurity: Not on file  Transportation Needs: Not on file  Physical Activity: Not on file  Stress: Not on file  Social Connections: Not on file  Intimate Partner Violence: Not on file   Health Maintenance  Topic Date Due   COVID-19 Vaccine (4 - Booster for Moderna series) 02/25/2021 (Originally 10/07/2020)   INFLUENZA VACCINE  03/01/2021   MAMMOGRAM  12/31/2022   TETANUS/TDAP  04/07/2025   COLONOSCOPY (Pts 45-18yrs Insurance coverage will need to be confirmed)  05/01/2029   DEXA SCAN  Completed   Hepatitis C Screening  Completed   Zoster Vaccines- Shingrix  Completed   HPV VACCINES  Aged Out   PNA vac Low Risk Adult  Discontinued    The following portions of the patient's history were reviewed and updated as appropriate: allergies, current medications, past family history, past medical history, past social history, past surgical history, and problem list.  Review of Systems Pertinent items noted in HPI and remainder of comprehensive ROS otherwise negative.   Objective:    BP (!) 133/107   Pulse 72   Ht 5\' 7"  (1.702 m)   Wt 175 lb (79.4 kg)   SpO2 98%   BMI 27.41 kg/m  General appearance:  alert, cooperative, and appears stated age Head: Normocephalic, without obvious abnormality, atraumatic Eyes: conjunctivae/corneas clear. PERRL, EOM's intact. Fundi benign. Ears: normal TM's and external ear canals both ears Nose: Nares normal. Septum midline. Mucosa normal. No drainage or sinus tenderness. Throat: lips, mucosa, and tongue normal; teeth and gums normal Neck: no adenopathy, no carotid bruit, no JVD, supple, symmetrical, trachea midline, and thyroid not enlarged, symmetric, no tenderness/mass/nodules Back: symmetric, no curvature. ROM normal. No CVA tenderness. Lungs: clear to auscultation bilaterally Heart: regular rate and rhythm, S1, S2 normal, no murmur, click, rub or gallop Abdomen: soft, non-tender; bowel sounds normal; no masses,  no organomegaly Extremities: extremities normal, atraumatic, no cyanosis or edema Pulses: 2+ and symmetric Skin: Skin color, texture, turgor normal. No rashes or lesions Lymph nodes: Cervical, supraclavicular, and axillary nodes normal. Neurologic: Grossly normal   .. Depression screen Susan Figueroa 2/9 02/09/2021 09/27/2019 10/23/2017  Decreased Interest 0 0 0  Down, Depressed, Hopeless 0 0 0  PHQ - 2 Score 0 0 0  Altered sleeping 0 0 -  Tired, decreased energy 0 0 -  Change in appetite 0 0 -  Feeling bad or failure about yourself  0 0 -  Trouble concentrating 0 0 -  Moving slowly or fidgety/restless 0 0 -  Suicidal thoughts 0 0 -  PHQ-9 Score 0 0 -  Difficult doing work/chores Not difficult at all Not difficult at all -    Assessment:    Healthy female exam.      Plan:    Marland KitchenMarland KitchenKinlie was seen today for annual exam.  Diagnoses and all orders for this visit:  Routine physical examination -     CBC with Differential/Platelet -     COMPLETE METABOLIC PANEL WITH GFR  Mixed hyperlipidemia  Screening for diabetes mellitus -     COMPLETE METABOLIC PANEL WITH GFR  Hypothyroidism, unspecified type  Stage 3b chronic kidney disease  (HCC)  Bilateral lower extremity edema -     hydrochlorothiazide (HYDRODIURIL) 25 MG tablet; TAKE 1 TABLET BY MOUTH EVERY DAY.  Acquired hypothyroidism -     levothyroxine (SYNTHROID) 50 MCG tablet; Take 1 tablet (50 mcg total) by mouth daily before breakfast.  Need for pneumococcal vaccination -     Pneumococcal conjugate vaccine 20-valent (Prevnar 20)  .[name] Discussed 150 minutes of exercise a week.  Encouraged vitamin D 1000 units and Calcium 1300mg  or 4 servings of dairy a day.  PHQ no concerns.  Mammogram UTD.  Colonoscopy UTD.  Pap not indicated.  Bone density UTD.  Fasting labs some done and brings in copy. LDL 126. TSH 2.17.  Refilled medications.  Pt did take herself off statin. Discussed trying to take it even just once a week.  Check cmp, cbc.  Covid UTD.  Shingles UTD.  BP rechecked and to goal.  Pneumonia 20 given today.   Discussed OAB and medication. She declines medication. She is doing kegals. Offered referral to pelvic floor PT. Declined.   See After Visit Summary for Counseling Recommendations

## 2021-02-09 NOTE — Patient Instructions (Addendum)
Overactive Bladder, Adult  Overactive bladder is a condition in which a person has a sudden and frequent need to urinate. A person might also leak urine if he or she cannot get to the bathroom fast enough (urinary incontinence). Sometimes, symptoms can interfere with work or social activities. What are the causes? Overactive bladder is associated with poor nerve signals between your bladder and your brain. Your bladder may get the signal to empty before it is full. You may also have very sensitive muscles that make your bladder squeeze too soon. This condition may also be caused by other factors, such as: Medical conditions: Urinary tract infection. Infection of nearby tissues. Prostate enlargement. Bladder stones, inflammation, or tumors. Diabetes. Muscle or nerve weakness, especially from these conditions: A spinal cord injury. Stroke. Multiple sclerosis. Parkinson's disease. Other causes: Surgery on the uterus or urethra. Drinking too much caffeine or alcohol. Certain medicines, especially those that eliminate extra fluid in the body (diuretics). Constipation. What increases the risk? You may be at greater risk for overactive bladder if you: Are an older adult. Smoke. Are going through menopause. Have prostate problems. Have a neurological disease, such as stroke, dementia, Parkinson's disease, or multiple sclerosis (MS). Eat or drink alcohol, spicy food, caffeine, and other things that irritate the bladder. Are overweight or obese. What are the signs or symptoms? Symptoms of this condition include a sudden, strong urge to urinate. Other symptoms include: Leaking urine. Urinating 8 or more times a day. Waking up to urinate 2 or more times overnight. How is this diagnosed? This condition may be diagnosed based on: Your symptoms and medical history. A physical exam. Blood or urine tests to check for possible causes, such as infection. You may also need to see a health care  provider who specializes in urinarytract problems. This is called a urologist. How is this treated? Treatment for overactive bladder depends on the cause of your condition and whether it is mild or severe. Treatment may include: Bladder training, such as: Learning to control the urge to urinate by following a schedule to urinate at regular intervals. Doing Kegel exercises to strengthen the pelvic floor muscles that support your bladder. Special devices, such as: Biofeedback. This uses sensors to help you become aware of your body's signals. Electrical stimulation. This uses electrodes placed inside the body (implanted) or outside the body. These electrodes send gentle pulses of electricity to strengthen the nerves or muscles that control the bladder. Women may use a plastic device, called a pessary, that fits into the vagina and supports the bladder. Medicines, such as: Antibiotics to treat bladder infection. Antispasmodics to stop the bladder from releasing urine at the wrong time. Tricyclic antidepressants to relax bladder muscles. Injections of botulinum toxin type A directly into the bladder tissue to relax bladder muscles. Surgery, such as: A device may be implanted to help manage the nerve signals that control urination. An electrode may be implanted to stimulate electrical signals in the bladder. A procedure may be done to change the shape of the bladder. This is done only in very severe cases. Follow these instructions at home: Eating and drinking  Make diet or lifestyle changes recommended by your health care provider. These may include: Drinking fluids throughout the day and not only with meals. Cutting down on caffeine or alcohol. Eating a healthy and balanced diet to prevent constipation. This may include: Choosing foods that are high in fiber, such as beans, whole grains, and fresh fruits and vegetables. Limiting foods that are  high in fat and processed sugars, such as fried  and sweet foods.  Lifestyle  Lose weight if needed. Do not use any products that contain nicotine or tobacco. These include cigarettes, chewing tobacco, and vaping devices, such as e-cigarettes. If you need help quitting, ask your health care provider.  General instructions Take over-the-counter and prescription medicines only as told by your health care provider. If you were prescribed an antibiotic medicine, take it as told by your health care provider. Do not stop taking the antibiotic even if you start to feel better. Use any implants or pessary as told by your health care provider. If needed, wear pads to absorb urine leakage. Keep a log to track how much and when you drink, and when you need to urinate. This will help your health care provider monitor your condition. Keep all follow-up visits. This is important. Contact a health care provider if: You have a fever or chills. Your symptoms do not get better with treatment. Your pain and discomfort get worse. You have more frequent urges to urinate. Get help right away if: You are not able to control your bladder. Summary Overactive bladder refers to a condition in which a person has a sudden and frequent need to urinate. Several conditions may lead to an overactive bladder. Treatment for overactive bladder depends on the cause and severity of your condition. Making lifestyle changes, doing Kegel exercises, keeping a log, and taking medicines can help with this condition. This information is not intended to replace advice given to you by your health care provider. Make sure you discuss any questions you have with your healthcare provider. Document Revised: 04/06/2020 Document Reviewed: 04/06/2020 Elsevier Patient Education  [place] Maintenance After Age 29 After age 33, you are at a higher risk for certain long-term diseases and infections as well as injuries from falls. Falls are a major cause of broken bones and  head injuries in people who are older than age 55. Getting regular preventive care can help to keep you healthy and well. Preventive care includes getting regular testing and making lifestyle changes as recommended by your health care provider. Talk with your health care provider about: Which screenings and tests you should have. A screening is a test that checks for a disease when you have no symptoms. A diet and exercise plan that is right for you. What should I know about screenings and tests to prevent falls? Screening and testing are the best ways to find a health problem early. Early diagnosis and treatment give you the best chance of managing medical conditions that are common after age 64. Certain conditions and lifestyle choices may make you more likely to have a fall. Your health care provider may recommend: Regular vision checks. Poor vision and conditions such as cataracts can make you more likely to have a fall. If you wear glasses, make sure to get your prescription updated if your vision changes. Medicine review. Work with your health care provider to regularly review all of the medicines you are taking, including over-the-counter medicines. Ask your health care provider about any side effects that may make you more likely to have a fall. Tell your health care provider if any medicines that you take make you feel dizzy or sleepy. Osteoporosis screening. Osteoporosis is a condition that causes the bones to get weaker. This can make the bones weak and cause them to break more easily. Blood pressure screening. Blood pressure changes and medicines to control blood pressure  can make you feel dizzy. Strength and balance checks. Your health care provider may recommend certain tests to check your strength and balance while standing, walking, or changing positions. Foot health exam. Foot pain and numbness, as well as not wearing proper footwear, can make you more likely to have a fall. Depression  screening. You may be more likely to have a fall if you have a fear of falling, feel emotionally low, or feel unable to do activities that you used to do. Alcohol use screening. Using too much alcohol can affect your balance and may make you more likely to have a fall. What actions can I take to lower my risk of falls? General instructions Talk with your health care provider about your risks for falling. Tell your health care provider if: You fall. Be sure to tell your health care provider about all falls, even ones that seem minor. You feel dizzy, sleepy, or off-balance. Take over-the-counter and prescription medicines only as told by your health care provider. These include any supplements. Eat a healthy diet and maintain a healthy weight. A healthy diet includes low-fat dairy products, low-fat (lean) meats, and fiber from whole grains, beans, and lots of fruits and vegetables. Home safety Remove any tripping hazards, such as rugs, cords, and clutter. Install safety equipment such as grab bars in bathrooms and safety rails on stairs. Keep rooms and walkways well-lit. Activity  Follow a regular exercise program to stay fit. This will help you maintain your balance. Ask your health care provider what types of exercise are appropriate for you. If you need a cane or walker, use it as recommended by your health care provider. Wear supportive shoes that have nonskid soles.  Lifestyle Do not drink alcohol if your health care provider tells you not to drink. If you drink alcohol, limit how much you have: 0-1 drink a day for women. 0-2 drinks a day for men. Be aware of how much alcohol is in your drink. In the [place] one drink equals one typical bottle of beer (12 oz), one-half glass of wine (5 oz), or one shot of hard liquor (1 oz). Do not use any products that contain nicotine or tobacco, such as cigarettes and e-cigarettes. If you need help quitting, ask your health care  provider. Summary Having a healthy lifestyle and getting preventive care can help to protect your health and wellness after age 32. Screening and testing are the best way to find a health problem early and help you avoid having a fall. Early diagnosis and treatment give you the best chance for managing medical conditions that are more common for people who are older than age 51. Falls are a major cause of broken bones and head injuries in people who are older than age 30. Take precautions to prevent a fall at home. Work with your health care provider to learn what changes you can make to improve your health and wellness and to prevent falls. This information is not intended to replace advice given to you by your health care provider. Make sure you discuss any questions you have with your healthcare provider. Document Revised: 07/03/2020 Document Reviewed: 07/03/2020 Elsevier Patient Education  2022 [employer].

## 2021-02-10 ENCOUNTER — Encounter: Payer: Self-pay | Admitting: Physician Assistant

## 2021-03-04 DIAGNOSIS — I781 Nevus, non-neoplastic: Secondary | ICD-10-CM | POA: Diagnosis not present

## 2021-03-04 DIAGNOSIS — D224 Melanocytic nevi of scalp and neck: Secondary | ICD-10-CM | POA: Diagnosis not present

## 2021-03-04 DIAGNOSIS — L570 Actinic keratosis: Secondary | ICD-10-CM | POA: Diagnosis not present

## 2021-03-04 DIAGNOSIS — L814 Other melanin hyperpigmentation: Secondary | ICD-10-CM | POA: Diagnosis not present

## 2021-04-06 ENCOUNTER — Telehealth: Payer: Self-pay | Admitting: Physician Assistant

## 2021-04-06 DIAGNOSIS — R519 Headache, unspecified: Secondary | ICD-10-CM | POA: Diagnosis not present

## 2021-04-06 DIAGNOSIS — R42 Dizziness and giddiness: Secondary | ICD-10-CM | POA: Diagnosis not present

## 2021-04-06 NOTE — Telephone Encounter (Signed)
Pt came in asking to be seen right away due to believing she had a mini stroke. Expressed sx such as loss of balance, swaying to her right and husband described out of character behavior. After pulling patient chart up for triage, saw hyperlipidemia and found a CMA right away. I was advised by Raelene Bott and Amber B. to tell the patient to go to the hospital. Pt said they would go right away and I told her that we would inform her PCP.

## 2021-04-06 NOTE — Telephone Encounter (Signed)
Yes. Best if she is evaluated also could be vertigo but needs to be ruled out. Agree with plan.

## 2021-04-11 DIAGNOSIS — R001 Bradycardia, unspecified: Secondary | ICD-10-CM | POA: Diagnosis not present

## 2021-04-14 ENCOUNTER — Other Ambulatory Visit: Payer: Self-pay

## 2021-04-14 ENCOUNTER — Encounter: Payer: Self-pay | Admitting: Physician Assistant

## 2021-04-14 ENCOUNTER — Ambulatory Visit: Payer: BC Managed Care – PPO | Admitting: Physician Assistant

## 2021-04-14 VITALS — BP 157/92 | HR 56 | Temp 97.8°F | Ht 67.0 in | Wt 174.0 lb

## 2021-04-14 DIAGNOSIS — N1832 Chronic kidney disease, stage 3b: Secondary | ICD-10-CM

## 2021-04-14 DIAGNOSIS — R030 Elevated blood-pressure reading, without diagnosis of hypertension: Secondary | ICD-10-CM

## 2021-04-14 DIAGNOSIS — Z823 Family history of stroke: Secondary | ICD-10-CM

## 2021-04-14 DIAGNOSIS — R1310 Dysphagia, unspecified: Secondary | ICD-10-CM

## 2021-04-14 DIAGNOSIS — R42 Dizziness and giddiness: Secondary | ICD-10-CM

## 2021-04-14 DIAGNOSIS — Z23 Encounter for immunization: Secondary | ICD-10-CM | POA: Diagnosis not present

## 2021-04-14 DIAGNOSIS — I679 Cerebrovascular disease, unspecified: Secondary | ICD-10-CM | POA: Diagnosis not present

## 2021-04-14 DIAGNOSIS — E7800 Pure hypercholesterolemia, unspecified: Secondary | ICD-10-CM

## 2021-04-14 MED ORDER — ATORVASTATIN CALCIUM 40 MG PO TABS: ORAL_TABLET | ORAL | 1 refills | Status: DC

## 2021-04-14 NOTE — Patient Instructions (Signed)
Influenza (Flu) Vaccine (Inactivated or Recombinant): What You Need to Know 1. Why get vaccinated? Influenza vaccine can prevent influenza (flu). Flu is a contagious disease that spreads around the [place] every year, usually between October and May. Anyone can get the flu, but it is more dangerous for some people. Infants and young children, people 65 years and older, pregnant people, and people with certain health conditions or a weakened immune system are at greatest risk of flu complications. Pneumonia, bronchitis, sinus infections, and ear infections are examples of flu-related complications. If you have a medical condition, such as heart disease, cancer, or diabetes, flu can make it worse. Flu can cause fever and chills, sore throat, muscle aches, fatigue, cough, headache, and runny or stuffy nose. Some people may have vomiting and diarrhea, though this is more common in children than adults. In an average year, thousands of people in the [place] die from flu, and many more are hospitalized. Flu vaccine prevents millions of illnesses and flu-related visits to the doctor each year. 2. Influenza vaccines CDC recommends everyone 6 months and older get vaccinated every flu season. Children 6 months through 8 years of age may need 2 doses during a single flu season. Everyone else needs only 1 dose each flu season. It takes about 2 weeks for protection to develop after vaccination. There are many flu viruses, and they are always changing. Each year a new flu vaccine is made to protect against the influenza viruses believed to be likely to cause disease in the upcoming flu season. Even when the vaccine doesn't exactly match these viruses, it may still provide some protection. Influenza vaccine does not cause flu. Influenza vaccine may be given at the same time as other vaccines. 3. Talk with your health care provider Tell your vaccination provider if the person getting the vaccine: Has had  an allergic reaction after a previous dose of influenza vaccine, or has any severe, life-threatening allergies Has ever had Guillain-Barr Syndrome (also called "GBS") In some cases, your health care provider may decide to postpone influenza vaccination until a future visit. Influenza vaccine can be administered at any time during pregnancy. People who are or will be pregnant during influenza season should receive inactivated influenza vaccine. People with minor illnesses, such as a cold, may be vaccinated. People who are moderately or severely ill should usually wait until they recover before getting influenza vaccine. Your health care provider can give you more information. 4. Risks of a vaccine reaction Soreness, redness, and swelling where the shot is given, fever, muscle aches, and headache can happen after influenza vaccination. There may be a very small increased risk of Guillain-Barr Syndrome (GBS) after inactivated influenza vaccine (the flu shot). Young children who get the flu shot along with pneumococcal vaccine (PCV13) and/or DTaP vaccine at the same time might be slightly more likely to have a seizure caused by fever. Tell your health care provider if a child who is getting flu vaccine has ever had a seizure. People sometimes faint after medical procedures, including vaccination. Tell your provider if you feel dizzy or have vision changes or ringing in the ears. As with any medicine, there is a very remote chance of a vaccine causing a severe allergic reaction, other serious injury, or death. 5. What if there is a serious problem? An allergic reaction could occur after the vaccinated person leaves the clinic. If you see signs of a severe allergic reaction (hives, swelling of the face and throat, difficulty breathing,   a fast heartbeat, dizziness, or weakness), call 9-1-1 and get the person to the nearest hospital. For other signs that concern you, call your health care provider. Adverse  reactions should be reported to the Vaccine Adverse Event Reporting System (VAERS). Your health care provider will usually file this report, or you can do it yourself. Visit the VAERS website at www.vaers.hhs.gov or call 1-800-822-7967. VAERS is only for reporting reactions, and VAERS staff members do not give medical advice. 6. The National Vaccine Injury Compensation Program The National Vaccine Injury Compensation Program (VICP) is a federal program that was created to compensate people who may have been injured by certain vaccines. Claims regarding alleged injury or death due to vaccination have a time limit for filing, which may be as short as two years. Visit the VICP website at www.hrsa.gov/vaccinecompensation or call 1-800-338-2382 to learn about the program and about filing a claim. 7. How can I learn more? Ask your health care provider. Call your local or state health department. Visit the website of the Food and Drug Administration (FDA) for vaccine package inserts and additional information at www.fda.gov/vaccines-blood-biologics/vaccines. Contact the Centers for Disease Control and Prevention (CDC): Call 1-800-232-4636 (1-800-CDC-INFO) or Visit CDC's website at www.cdc.gov/flu. Vaccine Information Statement Inactivated Influenza Vaccine (03/06/2020) This information is not intended to replace advice given to you by your health care provider. Make sure you discuss any questions you have with your health care provider. Document Revised: 04/23/2020 Document Reviewed: 04/23/2020 Elsevier Patient Education  2022 [employer] Inc.  

## 2021-04-14 NOTE — Progress Notes (Addendum)
Subjective:    Patient ID: Susan Figueroa, female    DOB: 10/25/1950, 70 y.o.   MRN: [id number]  HPI Pt is a 70 yo female with CKD, HLD, osteopenia, migraines who presents to the clinic for follow up after ED visit.   Pt suddenly had dizziness and headache on 04/06/2021. She was concerned due to her family history of stroke and went to ED. Denies any upper or lower skin weakness or speech issues. After ED visit symptoms resolved and have not had since.   NOTE from ED.   Your work-up is overall reassuring today. Your MRI shows no evidence of a stroke. You do have some chronic vessel changes likely related to age or history of migraines. Continue taking your aspirin. I would recommend that you follow-up with your primary care doctor as you will likely need to have an echocardiogram, carotid ultrasounds of your neck and your lipid and cholesterol levels checked. You will also follow with neurology for continued symptoms to see if this is something they would put you on for the therapy 4. Make sure you give your primary care doctor call today to see them as soon as you can. If you develop any other strokelike symptoms, worsening dizziness return to the ER immediately.  MRI showed no acute findings but significant for small microvascular changes on brain.   Pt is doing great today. Denies any CP, palpitations, headache or dizziness. She admits she has tried statins and just not wanted to continue due to myalgias.   Pt is also having some problems feeling like her food is stuck in the lower part of esophagus. This has been happening for last few months and starting to get a little more common. No vomiting. No hx of GERD or GERD symptoms.   .. Active Ambulatory Problems    Diagnosis Date Noted   Thyroid activity decreased 04/08/2015   Bilateral lower extremity edema 04/08/2015   CKD (chronic kidney disease) stage 3, GFR 30-59 ml/min (HCC) 04/10/2015   Hyperlipidemia 04/10/2015   Raynaud's disease  without gangrene 05/04/2015   No energy 05/04/2015   Snoring 05/04/2015   Sleep apnea 06/30/2015   Dry mouth 10/20/2015   Dry skin 10/20/2015   Urinary incontinence in female 12/25/2015   Chronic cystitis 04/15/2016   Dysuria 04/15/2016   Pure hypercholesterolemia 06/07/2017   Acute appendicitis with suppurative peritonitis s/p lap appendectomy 06/08/2017 06/08/2017   Arthritis of carpometacarpal (CMC) joint of both thumbs 10/23/2017   Primary osteoarthritis of right knee 01/01/2018   Colon adenoma 01/22/2018   Elevated serum creatinine 06/15/2018   Elevated blood pressure reading 06/15/2018   Elevated liver enzymes 06/15/2018   Osteopenia 09/25/2019   Cerebrovascular small vessel disease 04/16/2021   Family history of stroke 04/16/2021   Resolved Ambulatory Problems    Diagnosis Date Noted   Weight gain 10/20/2015   Abdominal discomfort 04/15/2016   Leukocytes in urine 04/15/2016   Right lower quadrant abdominal pain 06/07/2017   Absent bowel sounds 06/07/2017   Left lower quadrant pain 06/07/2017   Abdominal guarding 06/07/2017   Status post laparoscopic appendectomy 06/08/2017   Past Medical History:  Diagnosis Date   Acute appendicitis    Thyroid disease         Review of Systems See HPI.     Objective:   Physical Exam Vitals reviewed.  Constitutional:      Appearance: She is well-developed.  HENT:     Head: Normocephalic.  Eyes:  General: No visual field deficit. Cardiovascular:     Rate and Rhythm: Normal rate and regular rhythm.     Heart sounds: Normal heart sounds.  Pulmonary:     Effort: Pulmonary effort is normal.     Breath sounds: Normal breath sounds.  Abdominal:     General: Bowel sounds are normal.     Palpations: Abdomen is soft.  Skin:    Findings: No rash.  Neurological:     Mental Status: She is alert and oriented to person, place, and time.     Cranial Nerves: No cranial nerve deficit, dysarthria or facial asymmetry.      Sensory: No sensory deficit.     Motor: No weakness.     Coordination: Romberg sign negative. Coordination normal.     Gait: Gait normal.  Psychiatric:        Mood and Affect: Mood normal.          Assessment & Plan:  Marland KitchenMarland KitchenKynsley was seen today for follow-up and headache.  Diagnoses and all orders for this visit:  Dizziness  Need for influenza vaccination -     Flu Vaccine QUAD High Dose(Fluad)  Cerebrovascular small vessel disease -     atorvastatin (LIPITOR) 40 MG tablet; Take once a week. -     US Carotid Duplex Bilateral; Future  Stage 3b chronic kidney disease (HCC)  Pure hypercholesterolemia -     atorvastatin (LIPITOR) 40 MG tablet; Take once a week. -     US Carotid Duplex Bilateral; Future  Family history of stroke -     US Carotid Duplex Bilateral; Future  Dysphagia, unspecified type -     Ambulatory referral to Gastroenterology   Reassurance given that MRI looked good. Dizziness has resolved. She does have some chronic vessel changes on MRI. Her LDL is not to goal from recent labs.  Discussed statins agreed to start once a week and monitor symptoms. Recheck lipid in 4-6 months.  Follow up in 6 weeks.  Continue on ASA.  Fam hx of stroke will get carotid dopplers.  Hold on echo.  She has appt with neurology.   Having some chocking sensation in chest  mostly after eating for past 6 months. Referral to GI for EGD. Colonoscopy UTD. Not having GERD symptoms. Ok to try OTC pepcid or prilosec.   Elevated BP. Pt reports normal BP at home. Start checking and keep low. If BP over 130/90 then come back in for medication discussion. Lower salt in diet and make sure exercising.

## 2021-04-16 DIAGNOSIS — R42 Dizziness and giddiness: Secondary | ICD-10-CM | POA: Insufficient documentation

## 2021-04-16 DIAGNOSIS — Z823 Family history of stroke: Secondary | ICD-10-CM | POA: Insufficient documentation

## 2021-04-16 DIAGNOSIS — I679 Cerebrovascular disease, unspecified: Secondary | ICD-10-CM | POA: Insufficient documentation

## 2021-05-20 ENCOUNTER — Emergency Department (HOSPITAL_BASED_OUTPATIENT_CLINIC_OR_DEPARTMENT_OTHER)
Admission: EM | Admit: 2021-05-20 | Discharge: 2021-05-21 | Disposition: A | Discharge status: Home / Self Care | Payer: BC Managed Care – PPO | Attending: Emergency Medicine | Admitting: Emergency Medicine

## 2021-05-20 ENCOUNTER — Encounter (HOSPITAL_BASED_OUTPATIENT_CLINIC_OR_DEPARTMENT_OTHER): Payer: Self-pay

## 2021-05-20 ENCOUNTER — Other Ambulatory Visit: Payer: Self-pay

## 2021-05-20 DIAGNOSIS — J101 Influenza due to other identified influenza virus with other respiratory manifestations: Secondary | ICD-10-CM | POA: Diagnosis not present

## 2021-05-20 DIAGNOSIS — Z7982 Long term (current) use of aspirin: Secondary | ICD-10-CM | POA: Insufficient documentation

## 2021-05-20 DIAGNOSIS — R509 Fever, unspecified: Secondary | ICD-10-CM | POA: Insufficient documentation

## 2021-05-20 DIAGNOSIS — Z87891 Personal history of nicotine dependence: Secondary | ICD-10-CM | POA: Diagnosis not present

## 2021-05-20 DIAGNOSIS — N183 Chronic kidney disease, stage 3 unspecified: Secondary | ICD-10-CM | POA: Insufficient documentation

## 2021-05-20 DIAGNOSIS — J1089 Influenza due to other identified influenza virus with other manifestations: Secondary | ICD-10-CM | POA: Diagnosis not present

## 2021-05-20 DIAGNOSIS — Z79899 Other long term (current) drug therapy: Secondary | ICD-10-CM | POA: Diagnosis not present

## 2021-05-20 DIAGNOSIS — Z20822 Contact with and (suspected) exposure to covid-19: Secondary | ICD-10-CM | POA: Insufficient documentation

## 2021-05-20 LAB — RESP PANEL BY RT-PCR (FLU A&B, COVID) ARPGX2
Influenza A by PCR: POSITIVE — AB
Influenza B by PCR: NEGATIVE
SARS Coronavirus 2 by RT PCR: NEGATIVE

## 2021-05-20 MED ORDER — ONDANSETRON 4 MG PO TBDP: 4.0000 mg | ORAL_TABLET | Freq: Three times a day (TID) | ORAL | 0 refills | Status: DC | PRN

## 2021-05-20 MED ORDER — BENZONATATE 100 MG PO CAPS
100.0000 mg | ORAL_CAPSULE | Freq: Once | ORAL | Status: AC
  Administered 2021-05-20: 100 mg via ORAL
  Filled 2021-05-20: qty 1

## 2021-05-20 MED ORDER — BENZONATATE 100 MG PO CAPS: 100.0000 mg | ORAL_CAPSULE | Freq: Three times a day (TID) | ORAL | 0 refills | Status: DC | PRN

## 2021-05-20 MED ORDER — ONDANSETRON 4 MG PO TBDP
8.0000 mg | ORAL_TABLET | Freq: Once | ORAL | Status: AC
  Administered 2021-05-20: 8 mg via ORAL
  Filled 2021-05-20: qty 2

## 2021-05-20 NOTE — ED Provider Notes (Signed)
[facility] EMERGENCY DEPARTMENT Provider Note   CSN: [id number] Arrival date & time: 05/20/21  2118     History Chief Complaint  Patient presents with   Fever    Susan Figueroa is a 70 y.o. female.  HPI Patient is a 70 year old female with a medical history as noted below.  She presents to the emergency department due to cough, fatigue, fevers, chills, diarrhea, nausea, vomiting that started about 4 days ago.  Patient states that she received the flu vaccine 1 month ago.  Denies chest pain or shortness of breath.    Past Medical History:  Diagnosis Date   Acute appendicitis    Thyroid disease     Patient Active Problem List   Diagnosis Date Noted   Cerebrovascular small vessel disease 04/16/2021   Family history of stroke 04/16/2021   Dizziness 04/16/2021   Osteopenia 09/25/2019   Elevated serum creatinine 06/15/2018   Elevated blood pressure reading 06/15/2018   Elevated liver enzymes 06/15/2018   Colon adenoma 01/22/2018   Primary osteoarthritis of right knee 01/01/2018   Arthritis of carpometacarpal [facility]) joint of both thumbs 10/23/2017   Acute appendicitis with suppurative peritonitis s/p lap appendectomy 06/08/2017 06/08/2017   Pure hypercholesterolemia 06/07/2017   Chronic cystitis 04/15/2016   Dysuria 04/15/2016   Urinary incontinence in female 12/25/2015   Dry mouth 10/20/2015   Dry skin 10/20/2015   Sleep apnea 06/30/2015   Raynaud's disease without gangrene 05/04/2015   No energy 05/04/2015   Snoring 05/04/2015   CKD (chronic kidney disease) stage 3, GFR 30-59 ml/min ([facility]) 04/10/2015   Hyperlipidemia 04/10/2015   Thyroid activity decreased 04/08/2015   Bilateral lower extremity edema 04/08/2015    Past Surgical History:  Procedure Laterality Date   ABDOMINAL HYSTERECTOMY     LAPAROSCOPIC APPENDECTOMY N/A 06/07/2017   Procedure: APPENDECTOMY LAPAROSCOPIC;  Surgeon: Johnathan Hausen, MD;  Location: [facility];  Service: General;  Laterality: N/A;      OB History   No obstetric history on file.     Family History  Problem Relation Age of Onset   Cancer Mother        cervical   Hypertension Brother    Stroke Maternal Grandmother    Heart attack Maternal Grandfather     Social History   Tobacco Use   Smoking status: Former    Types: Cigarettes   Smokeless tobacco: Never  Vaping Use   Vaping Use: Never used  Substance Use Topics   Alcohol use: No    Alcohol/week: 0.0 standard drinks   Drug use: No    Home Medications Prior to Admission medications   Medication Sig Start Date End Date Taking? Authorizing Provider  benzonatate (TESSALON) 100 MG capsule Take 1 capsule (100 mg total) by mouth 3 (three) times daily as needed for cough. 05/20/21  Yes Rayna Sexton, PA-C  ondansetron (ZOFRAN ODT) 4 MG disintegrating tablet Take 1 tablet (4 mg total) by mouth every 8 (eight) hours as needed for nausea or vomiting. 05/20/21  Yes Rayna Sexton, PA-C  aspirin 81 MG EC tablet Take 1 tablet by mouth daily.    [provider]  atorvastatin (LIPITOR) 40 MG tablet Take once a week. 04/14/21   Breeback, Jade L, PA-C  hydrochlorothiazide (HYDRODIURIL) 25 MG tablet TAKE 1 TABLET BY MOUTH EVERY DAY. 02/09/21   [name], PA-C  levothyroxine (SYNTHROID) 50 MCG tablet Take 1 tablet (50 mcg total) by mouth daily before breakfast. 02/09/21   [name], PA-C  Allergies    Lipitor [atorvastatin]  Review of Systems   Review of Systems  Constitutional:  Positive for activity change, appetite change, chills, fatigue and fever.  Respiratory:  Positive for cough. Negative for shortness of breath.   Cardiovascular:  Negative for chest pain.  Gastrointestinal:  Positive for diarrhea, nausea and vomiting.   Physical Exam Updated Vital Signs BP 119/80 (BP Location: Left Arm)   Pulse 74   Temp 97.8 F (36.6 C) (Oral)   Resp 18   Ht 5\' 7"  (1.702 m)   Wt 71.7 kg   SpO2 97%   BMI 24.75 kg/m   Physical  Exam Vitals and nursing note reviewed.  Constitutional:      General: She is not in acute distress.    Appearance: Normal appearance. She is not ill-appearing, toxic-appearing or diaphoretic.     Comments: Non toxic appearing.  HENT:     Head: Normocephalic and atraumatic.     Right Ear: External ear normal.     Left Ear: External ear normal.     Nose: Nose normal.     Mouth/Throat:     Mouth: Mucous membranes are moist.     Pharynx: Oropharynx is clear. No oropharyngeal exudate or posterior oropharyngeal erythema.  Eyes:     General: No scleral icterus.       Right eye: No discharge.        Left eye: No discharge.     Extraocular Movements: Extraocular movements intact.     Conjunctiva/sclera: Conjunctivae normal.  Cardiovascular:     Rate and Rhythm: Normal rate and regular rhythm.     Pulses: Normal pulses.     Heart sounds: Normal heart sounds. No murmur heard.   No friction rub. No gallop.  Pulmonary:     Effort: Pulmonary effort is normal. No respiratory distress.     Breath sounds: Normal breath sounds. No stridor. No wheezing, rhonchi or rales.  Abdominal:     General: Abdomen is flat. There is no distension.     Palpations: Abdomen is soft.     Tenderness: There is no abdominal tenderness.  Musculoskeletal:        General: Normal range of motion.     Cervical back: Normal range of motion and neck supple. No tenderness.  Skin:    General: Skin is warm and dry.  Neurological:     General: No focal deficit present.     Mental Status: She is alert and oriented to person, place, and time.  Psychiatric:        Mood and Affect: Mood normal.        Behavior: Behavior normal.    ED Results / Procedures / Treatments   Labs (all labs ordered are listed, but only abnormal results are displayed) Labs Reviewed  RESP PANEL BY RT-PCR (FLU A&B, COVID) ARPGX2 - Abnormal; Notable for the following components:      Result Value   Influenza A by PCR POSITIVE (*)    All other  components within normal limits   EKG None  Radiology No results found.  Procedures Procedures   Medications Ordered in ED Medications  ondansetron (ZOFRAN-ODT) disintegrating tablet 8 mg (has no administration in time range)  benzonatate (TESSALON) capsule 100 mg (has no administration in time range)   ED Course  I have reviewed the triage vital signs and the nursing notes.  Pertinent labs & imaging results that were available during my care of the patient were reviewed by me  and considered in my medical decision making (see chart for details).    MDM Rules/Calculators/A&P                          Patient is a 70 year old female who presents to the emergency department with what appears to be symptoms associated with influenza A.  Symptoms started about 4 days ago.  Physical exam is generally reassuring.  Lungs are clear to auscultation bilaterally.  Heart is regular rate and rhythm without murmurs, rubs, or gallops.  Given the duration of her symptoms Tamiflu does not appear warranted at this time.  We will treat her cough with Tessalon Perles and her nausea/vomiting with Zofran.  First doses given in the emergency department.  Feel that she is stable for discharge at this time and she is agreeable.  We discussed return precautions at length.  Her questions were answered and she was amicable at the time of discharge.  Final Clinical Impression(s) / ED Diagnoses Final diagnoses:  Influenza A    Rx / DC Orders ED Discharge Orders          Ordered    ondansetron (ZOFRAN ODT) 4 MG disintegrating tablet  Every 8 hours PRN        05/20/21 2317    benzonatate (TESSALON) 100 MG capsule  3 times daily PRN        05/20/21 2317             Rayna Sexton, PA-C 05/20/21 2340    Davonna Belling, MD 05/21/21 0015

## 2021-05-20 NOTE — Discharge Instructions (Addendum)
You have been diagnosed with flu A.  I prescribed you 2 medications.  The first medication is called Tessalon Perles.  This is a cough medication that you can take up to 3 times a day for your cough.  Please only take this as prescribed.  I am also prescribing you a medication called Zofran.  This is a disintegrating tablet you can use up to 3 times a day for management of your nausea and vomiting.  Please only take this as prescribed.  Please only take this if you are experiencing nausea and vomiting that you cannot control.

## 2021-05-20 NOTE — ED Triage Notes (Signed)
Pt c/o fever, chills, cough, diarrhea-sx started 10/16-NAD-to triage in w/c

## 2021-08-09 ENCOUNTER — Encounter: Payer: Self-pay | Admitting: Physician Assistant

## 2021-08-09 ENCOUNTER — Ambulatory Visit: Payer: BC Managed Care – PPO | Admitting: Physician Assistant

## 2021-08-09 ENCOUNTER — Other Ambulatory Visit: Payer: Self-pay

## 2021-08-09 VITALS — BP 138/90 | HR 62 | Ht 67.0 in | Wt 162.0 lb

## 2021-08-09 DIAGNOSIS — R1110 Vomiting, unspecified: Secondary | ICD-10-CM | POA: Diagnosis not present

## 2021-08-09 DIAGNOSIS — T17308A Unspecified foreign body in larynx causing other injury, initial encounter: Secondary | ICD-10-CM | POA: Diagnosis not present

## 2021-08-09 DIAGNOSIS — E039 Hypothyroidism, unspecified: Secondary | ICD-10-CM | POA: Diagnosis not present

## 2021-08-09 NOTE — Progress Notes (Signed)
Subjective:    Patient ID: Susan Figueroa, female    DOB: 12-07-1950, 71 y.o.   MRN: [id number]  HPI Pt is a 71 yo female with hypothyroidism who presents to the clinic to discus choking episode last Thursday, 5 days ago. She has had the sensation that food is getting stuck in her middle chest from time to time but drinking water would seem to resolve it in a few minutes. This episode was different. She was eating sweet potato casserole and it felt like got lodge in her central chest. She drank water and did not help. She had a lot of discomfort and kept drinking water and coughing until she vomited up spit, water, then the cassarole. Denies any bowel changes. Denies any significant reflux issues. No problems swallowing.   She did have the flu a few weeks ago and had a lot of GI issues but have since resolved.   .. Active Ambulatory Problems    Diagnosis Date Noted   Thyroid activity decreased 04/08/2015   Bilateral lower extremity edema 04/08/2015   CKD (chronic kidney disease) stage 3, GFR 30-59 ml/min ([facility]) 04/10/2015   Hyperlipidemia 04/10/2015   Raynaud's disease without gangrene 05/04/2015   No energy 05/04/2015   Snoring 05/04/2015   Sleep apnea 06/30/2015   Dry mouth 10/20/2015   Dry skin 10/20/2015   Urinary incontinence in female 12/25/2015   Chronic cystitis 04/15/2016   Dysuria 04/15/2016   Pure hypercholesterolemia 06/07/2017   Acute appendicitis with suppurative peritonitis s/p lap appendectomy 06/08/2017 06/08/2017   Arthritis of carpometacarpal ([facility]) joint of both thumbs 10/23/2017   Primary osteoarthritis of right knee 01/01/2018   Colon adenoma 01/22/2018   Elevated serum creatinine 06/15/2018   Elevated blood pressure reading 06/15/2018   Elevated liver enzymes 06/15/2018   Osteopenia 09/25/2019   Cerebrovascular small vessel disease 04/16/2021   Family history of stroke 04/16/2021   Dizziness 04/16/2021   Resolved Ambulatory Problems    Diagnosis Date Noted    Weight gain 10/20/2015   Abdominal discomfort 04/15/2016   Leukocytes in urine 04/15/2016   Right lower quadrant abdominal pain 06/07/2017   Absent bowel sounds 06/07/2017   Left lower quadrant pain 06/07/2017   Abdominal guarding 06/07/2017   Status post laparoscopic appendectomy 06/08/2017   Past Medical History:  Diagnosis Date   Acute appendicitis    Thyroid disease         Review of Systems    See HPI.  Objective:   Physical Exam Vitals reviewed.  Constitutional:      Appearance: Normal appearance.  HENT:     Head: Normocephalic.  Neck:     Vascular: No carotid bruit.  Cardiovascular:     Rate and Rhythm: Normal rate and regular rhythm.     Pulses: Normal pulses.  Pulmonary:     Effort: Pulmonary effort is normal.     Breath sounds: Normal breath sounds.  Abdominal:     General: Bowel sounds are normal. There is no distension.     Palpations: Abdomen is soft. There is no mass.     Tenderness: There is no abdominal tenderness. There is no right CVA tenderness, left CVA tenderness, guarding or rebound.     Hernia: No hernia is present.  Musculoskeletal:     Cervical back: Normal range of motion and neck supple. No rigidity or tenderness.  Lymphadenopathy:     Cervical: No cervical adenopathy.  Neurological:     General: No focal deficit present.  Mental Status: She is alert.  Psychiatric:        Mood and Affect: Mood normal.          Assessment & Plan:  Marland KitchenMarland KitchenKarolina was seen today for gastroesophageal reflux.  Diagnoses and all orders for this visit:  Choking, initial encounter -     COMPLETE METABOLIC PANEL WITH GFR -     TSH -     Ambulatory referral to Gastroenterology  Vomiting, unspecified vomiting type, unspecified whether nausea present -     COMPLETE METABOLIC PANEL WITH GFR -     TSH -     Ambulatory referral to Gastroenterology   ?esophageal stricture/narrowing/spasm [facility] her  colonoscopy Chew food well and more times than she usually does and make sure to drink water with meals

## 2021-08-09 NOTE — Patient Instructions (Signed)
Will make endoscopy referral and get labs today

## 2021-08-10 LAB — COMPLETE METABOLIC PANEL WITH GFR
AG Ratio: 1.4 (calc) (ref 1.0–2.5)
ALT: 14 U/L (ref 6–29)
AST: 17 U/L (ref 10–35)
Albumin: 4.3 g/dL (ref 3.6–5.1)
Alkaline phosphatase (APISO): 95 U/L (ref 37–153)
BUN/Creatinine Ratio: 13 (calc) (ref 6–22)
BUN: 15 mg/dL (ref 7–25)
CO2: 28 mmol/L (ref 20–32)
Calcium: 10.1 mg/dL (ref 8.6–10.4)
Chloride: 101 mmol/L (ref 98–110)
Creat: 1.14 mg/dL — ABNORMAL HIGH (ref 0.50–1.05)
Globulin: 3 g/dL (calc) (ref 1.9–3.7)
Glucose, Bld: 87 mg/dL (ref 65–99)
Potassium: 3.5 mmol/L (ref 3.5–5.3)
Sodium: 139 mmol/L (ref 135–146)
Total Bilirubin: 0.5 mg/dL (ref 0.2–1.2)
Total Protein: 7.3 g/dL (ref 6.1–8.1)
eGFR: 52 mL/min/{1.73_m2} — ABNORMAL LOW (ref >=60)

## 2021-08-10 LAB — TSH: TSH: 1.67 mIU/L (ref 0.40–4.50)

## 2021-08-10 NOTE — Progress Notes (Signed)
Kidney function has improved from 1 year ago.  Electrolytes look great Normal thyroid.   Labs look good.

## 2021-08-13 DIAGNOSIS — H35341 Macular cyst, hole, or pseudohole, right eye: Secondary | ICD-10-CM | POA: Diagnosis not present

## 2021-08-13 DIAGNOSIS — H35371 Puckering of macula, right eye: Secondary | ICD-10-CM | POA: Diagnosis not present

## 2021-09-02 DIAGNOSIS — L821 Other seborrheic keratosis: Secondary | ICD-10-CM | POA: Diagnosis not present

## 2021-09-09 DIAGNOSIS — R1310 Dysphagia, unspecified: Secondary | ICD-10-CM | POA: Diagnosis not present

## 2021-09-09 DIAGNOSIS — K219 Gastro-esophageal reflux disease without esophagitis: Secondary | ICD-10-CM | POA: Diagnosis not present

## 2021-09-09 DIAGNOSIS — Z86010 Personal history of colonic polyps: Secondary | ICD-10-CM | POA: Diagnosis not present

## 2021-09-26 ENCOUNTER — Other Ambulatory Visit: Payer: Self-pay | Admitting: Physician Assistant

## 2021-09-26 DIAGNOSIS — E7800 Pure hypercholesterolemia, unspecified: Secondary | ICD-10-CM

## 2021-09-26 DIAGNOSIS — I679 Cerebrovascular disease, unspecified: Secondary | ICD-10-CM

## 2021-11-04 DIAGNOSIS — K222 Esophageal obstruction: Secondary | ICD-10-CM | POA: Diagnosis not present

## 2021-11-04 DIAGNOSIS — K6389 Other specified diseases of intestine: Secondary | ICD-10-CM | POA: Diagnosis not present

## 2021-11-04 DIAGNOSIS — Z86010 Personal history of colonic polyps: Secondary | ICD-10-CM | POA: Diagnosis not present

## 2021-11-04 DIAGNOSIS — Z09 Encounter for follow-up examination after completed treatment for conditions other than malignant neoplasm: Secondary | ICD-10-CM | POA: Diagnosis not present

## 2021-11-04 DIAGNOSIS — R1310 Dysphagia, unspecified: Secondary | ICD-10-CM | POA: Diagnosis not present

## 2021-11-04 DIAGNOSIS — K635 Polyp of colon: Secondary | ICD-10-CM | POA: Diagnosis not present

## 2021-11-16 ENCOUNTER — Other Ambulatory Visit: Payer: Self-pay | Admitting: Physician Assistant

## 2021-11-16 DIAGNOSIS — Z1231 Encounter for screening mammogram for malignant neoplasm of breast: Secondary | ICD-10-CM

## 2021-11-18 DIAGNOSIS — L820 Inflamed seborrheic keratosis: Secondary | ICD-10-CM | POA: Diagnosis not present

## 2022-01-06 ENCOUNTER — Ambulatory Visit (INDEPENDENT_AMBULATORY_CARE_PROVIDER_SITE_OTHER): Payer: BC Managed Care – PPO

## 2022-01-06 DIAGNOSIS — Z1231 Encounter for screening mammogram for malignant neoplasm of breast: Secondary | ICD-10-CM

## 2022-01-07 NOTE — Progress Notes (Signed)
Normal mammogram. Follow up in 1 year.

## 2022-02-10 DIAGNOSIS — H35341 Macular cyst, hole, or pseudohole, right eye: Secondary | ICD-10-CM | POA: Diagnosis not present

## 2022-02-10 DIAGNOSIS — H35371 Puckering of macula, right eye: Secondary | ICD-10-CM | POA: Diagnosis not present

## 2022-02-10 DIAGNOSIS — Z961 Presence of intraocular lens: Secondary | ICD-10-CM | POA: Diagnosis not present

## 2022-02-10 DIAGNOSIS — H43393 Other vitreous opacities, bilateral: Secondary | ICD-10-CM | POA: Diagnosis not present

## 2022-03-01 DIAGNOSIS — D485 Neoplasm of uncertain behavior of skin: Secondary | ICD-10-CM | POA: Diagnosis not present

## 2022-03-01 DIAGNOSIS — L578 Other skin changes due to chronic exposure to nonionizing radiation: Secondary | ICD-10-CM | POA: Diagnosis not present

## 2022-03-01 DIAGNOSIS — D225 Melanocytic nevi of trunk: Secondary | ICD-10-CM | POA: Diagnosis not present

## 2022-03-01 DIAGNOSIS — L821 Other seborrheic keratosis: Secondary | ICD-10-CM | POA: Diagnosis not present

## 2022-03-28 ENCOUNTER — Telehealth: Payer: Self-pay | Admitting: Physician Assistant

## 2022-03-28 NOTE — Telephone Encounter (Signed)
Patient called and would like a call back regarding vaccines - [place].

## 2022-03-29 NOTE — Telephone Encounter (Signed)
V.Mail left for patient to return call with information.

## 2022-04-05 ENCOUNTER — Other Ambulatory Visit: Payer: Self-pay | Admitting: Physician Assistant

## 2022-04-05 DIAGNOSIS — R600 Localized edema: Secondary | ICD-10-CM

## 2022-04-05 DIAGNOSIS — E039 Hypothyroidism, unspecified: Secondary | ICD-10-CM

## 2022-04-11 ENCOUNTER — Other Ambulatory Visit: Payer: Self-pay | Admitting: Physician Assistant

## 2022-04-11 DIAGNOSIS — E7800 Pure hypercholesterolemia, unspecified: Secondary | ICD-10-CM

## 2022-04-11 DIAGNOSIS — Z823 Family history of stroke: Secondary | ICD-10-CM

## 2022-04-11 DIAGNOSIS — I679 Cerebrovascular disease, unspecified: Secondary | ICD-10-CM

## 2022-04-12 DIAGNOSIS — Z23 Encounter for immunization: Secondary | ICD-10-CM | POA: Diagnosis not present

## 2022-06-13 DIAGNOSIS — L579 Skin changes due to chronic exposure to nonionizing radiation, unspecified: Secondary | ICD-10-CM | POA: Diagnosis not present

## 2022-06-13 DIAGNOSIS — L820 Inflamed seborrheic keratosis: Secondary | ICD-10-CM | POA: Diagnosis not present

## 2022-06-30 ENCOUNTER — Other Ambulatory Visit: Payer: Self-pay | Admitting: Physician Assistant

## 2022-06-30 DIAGNOSIS — E039 Hypothyroidism, unspecified: Secondary | ICD-10-CM

## 2022-07-05 ENCOUNTER — Other Ambulatory Visit: Payer: Self-pay | Admitting: Physician Assistant

## 2022-07-05 DIAGNOSIS — R600 Localized edema: Secondary | ICD-10-CM

## 2022-07-28 ENCOUNTER — Telehealth: Payer: Self-pay | Admitting: Physician Assistant

## 2022-07-28 DIAGNOSIS — R600 Localized edema: Secondary | ICD-10-CM

## 2022-07-28 MED ORDER — HYDROCHLOROTHIAZIDE 25 MG PO TABS: ORAL_TABLET | ORAL | 0 refills | Status: DC

## 2022-07-28 NOTE — Telephone Encounter (Signed)
Patient scheduled on 08/29/2022 for hydrochlorothiazide (HYDRODIURIL) med refills with PCP, thanks.

## 2022-07-28 NOTE — Addendum Note (Signed)
Addended by: [name] on: 07/28/2022 11:28 AM   Modules accepted: Orders

## 2022-07-28 NOTE — Telephone Encounter (Signed)
Please contact patient to schedule appt with PCP. Unable to refill medication. Thanks

## 2022-08-29 ENCOUNTER — Encounter: Payer: Self-pay | Admitting: Physician Assistant

## 2022-08-29 ENCOUNTER — Ambulatory Visit: Payer: No Typology Code available for payment source | Admitting: Physician Assistant

## 2022-08-29 VITALS — BP 166/95 | HR 64 | Ht 67.0 in | Wt 174.0 lb

## 2022-08-29 DIAGNOSIS — I10 Essential (primary) hypertension: Secondary | ICD-10-CM

## 2022-08-29 DIAGNOSIS — G720 Drug-induced myopathy: Secondary | ICD-10-CM | POA: Diagnosis not present

## 2022-08-29 DIAGNOSIS — Z780 Asymptomatic menopausal state: Secondary | ICD-10-CM

## 2022-08-29 DIAGNOSIS — Z131 Encounter for screening for diabetes mellitus: Secondary | ICD-10-CM

## 2022-08-29 DIAGNOSIS — E782 Mixed hyperlipidemia: Secondary | ICD-10-CM

## 2022-08-29 DIAGNOSIS — E039 Hypothyroidism, unspecified: Secondary | ICD-10-CM | POA: Diagnosis not present

## 2022-08-29 DIAGNOSIS — T466X5A Adverse effect of antihyperlipidemic and antiarteriosclerotic drugs, initial encounter: Secondary | ICD-10-CM

## 2022-08-29 DIAGNOSIS — Z823 Family history of stroke: Secondary | ICD-10-CM

## 2022-08-29 MED ORDER — LISINOPRIL-HYDROCHLOROTHIAZIDE 20-25 MG PO TABS: 1.0000 | ORAL_TABLET | Freq: Every day | ORAL | 1 refills | Status: DC

## 2022-08-30 DIAGNOSIS — I10 Essential (primary) hypertension: Secondary | ICD-10-CM | POA: Insufficient documentation

## 2022-08-30 DIAGNOSIS — Z780 Asymptomatic menopausal state: Secondary | ICD-10-CM | POA: Insufficient documentation

## 2022-08-30 DIAGNOSIS — G720 Drug-induced myopathy: Secondary | ICD-10-CM | POA: Insufficient documentation

## 2022-08-30 NOTE — Progress Notes (Signed)
Established Patient Office Visit  Subjective   Patient ID: Susan Figueroa, female    DOB: July 29, 1951  Age: 72 y.o. MRN: [id number]  Chief Complaint  Patient presents with   Follow-up   Hypertension    HPI Pt is a 72 yo female with HTN, HLD, Hypothyroidism, OSA who presents to the clinic for follow up.   She feels great. No concerns. She takes HcTZ and synthroid daily. She does not check BP at home. She does have family hx of stroke which concerns her. She is fairly active. She continues to work.   Patient Active Problem List   Diagnosis Date Noted   Post-menopausal 08/30/2022   Primary hypertension 08/30/2022   Statin myopathy 08/30/2022   Cerebrovascular small vessel disease 04/16/2021   Family history of stroke 04/16/2021   Dizziness 04/16/2021   Osteopenia 09/25/2019   Elevated serum creatinine 06/15/2018   Elevated blood pressure reading 06/15/2018   Elevated liver enzymes 06/15/2018   Colon adenoma 01/22/2018   Primary osteoarthritis of right knee 01/01/2018   Arthritis of carpometacarpal (CMC) joint of both thumbs 10/23/2017   Acute appendicitis with suppurative peritonitis s/p lap appendectomy 06/08/2017 06/08/2017   Pure hypercholesterolemia 06/07/2017   Chronic cystitis 04/15/2016   Dysuria 04/15/2016   Urinary incontinence in female 12/25/2015   Dry mouth 10/20/2015   Dry skin 10/20/2015   Sleep apnea 06/30/2015   Raynaud's disease without gangrene 05/04/2015   No energy 05/04/2015   Snoring 05/04/2015   CKD (chronic kidney disease) stage 3, GFR 30-59 ml/min ([facility]) 04/10/2015   Hyperlipidemia 04/10/2015   Thyroid activity decreased 04/08/2015   Bilateral lower extremity edema 04/08/2015   Past Medical History:  Diagnosis Date   Acute appendicitis    Thyroid disease    Past Surgical History:  Procedure Laterality Date   ABDOMINAL HYSTERECTOMY     LAPAROSCOPIC APPENDECTOMY N/A 06/07/2017   Procedure: APPENDECTOMY LAPAROSCOPIC;  Surgeon: Johnathan Hausen,  MD;  Location: [facility];  Service: General;  Laterality: N/A;   Family History  Problem Relation Age of Onset   Cancer Mother        cervical   Hypertension Brother    Stroke Maternal Grandmother    Heart attack Maternal Grandfather    Allergies  Allergen Reactions   Lipitor [Atorvastatin]     myalgia      Review of Systems  All other systems reviewed and are negative.     Objective:     BP (!) 166/95   Pulse 64   Ht '5\' 7"'$  (1.702 m)   Wt 174 lb (78.9 kg)   SpO2 98%   BMI 27.25 kg/m  BP Readings from Last 3 Encounters:  08/29/22 (!) 166/95  08/09/21 138/90  05/20/21 (!) 146/83   Wt Readings from Last 3 Encounters:  08/29/22 174 lb (78.9 kg)  08/09/21 162 lb (73.5 kg)  05/20/21 158 lb (71.7 kg)        Physical Exam Constitutional:      Appearance: Normal appearance.  HENT:     Head: Normocephalic.  Neck:     Vascular: No carotid bruit.  Cardiovascular:     Rate and Rhythm: Normal rate and regular rhythm.     Pulses: Normal pulses.     Heart sounds: Normal heart sounds.  Pulmonary:     Effort: Pulmonary effort is normal.     Breath sounds: Normal breath sounds.  Musculoskeletal:     Cervical back: Normal range of motion and neck supple. No  rigidity or tenderness.     Right lower leg: No edema.     Left lower leg: No edema.  Lymphadenopathy:     Cervical: No cervical adenopathy.  Neurological:     General: No focal deficit present.     Mental Status: She is alert and oriented to person, place, and time.  Psychiatric:        Mood and Affect: Mood normal.      The 10-year ASCVD risk score (Arnett DK, et al., 2019) is: 18.1%* (Cholesterol units were assumed)    Assessment & Plan:  Marland KitchenMarland KitchenSemira was seen today for follow-up and hypertension.  Diagnoses and all orders for this visit:  Primary hypertension -     lisinopril-hydrochlorothiazide (ZESTORETIC) 20-25 MG tablet; Take 1 tablet by mouth daily.  Hypothyroidism, unspecified type -      TSH -     CBC with Differential/Platelet  Mixed hyperlipidemia -     Lipid Panel w/reflex Direct LDL -     CBC with Differential/Platelet  Screening for diabetes mellitus -     COMPLETE METABOLIC PANEL WITH GFR  Post-menopausal -     VITAMIN D 25 Hydroxy (Vit-D Deficiency, Fractures)  Statin myopathy  Family history of stroke    BP not to goal.  Added lisinopril to HCTZ Discussed side effects and risk of uncontrolled HTN Recheck in 2 weeks with nurse visit and get fasting labs Discussed CV risk and family hx of stroke Pt is aware and did not tolerate lipitor and hesitant to start statin Recommended ASA '81mg'$  daily Encouraged exercise 150 minutes a week Will refill medications and make suggestions accordingly  Return in about 2 weeks (around 09/12/2022).    Iran Planas, PA-C

## 2022-09-12 ENCOUNTER — Ambulatory Visit: Payer: No Typology Code available for payment source | Admitting: Physician Assistant

## 2022-09-13 ENCOUNTER — Other Ambulatory Visit: Payer: Self-pay | Admitting: Neurology

## 2022-09-13 DIAGNOSIS — E039 Hypothyroidism, unspecified: Secondary | ICD-10-CM

## 2022-09-13 LAB — CBC WITH DIFFERENTIAL/PLATELET
Absolute Monocytes: 627 cells/uL (ref 200–950)
Basophils Absolute: 32 cells/uL (ref 0–200)
Basophils Relative: 0.5 %
Eosinophils Absolute: 218 cells/uL (ref 15–500)
Eosinophils Relative: 3.4 %
HCT: 40.6 % (ref 35.0–45.0)
Hemoglobin: 13.9 g/dL (ref 11.7–15.5)
Lymphs Abs: 2189 cells/uL (ref 850–3900)
MCH: 30.2 pg (ref 27.0–33.0)
MCHC: 34.2 g/dL (ref 32.0–36.0)
MCV: 88.1 fL (ref 80.0–100.0)
MPV: 10.5 fL (ref 7.5–12.5)
Monocytes Relative: 9.8 %
Neutro Abs: 3334 cells/uL (ref 1500–7800)
Neutrophils Relative %: 52.1 %
Platelets: 300 10*3/uL (ref 140–400)
RBC: 4.61 10*6/uL (ref 3.80–5.10)
RDW: 12.9 % (ref 11.0–15.0)
Total Lymphocyte: 34.2 %
WBC: 6.4 10*3/uL (ref 3.8–10.8)

## 2022-09-13 LAB — COMPLETE METABOLIC PANEL WITH GFR
AG Ratio: 1.4 (calc) (ref 1.0–2.5)
ALT: 15 U/L (ref 6–29)
AST: 18 U/L (ref 10–35)
Albumin: 4.5 g/dL (ref 3.6–5.1)
Alkaline phosphatase (APISO): 80 U/L (ref 37–153)
BUN/Creatinine Ratio: 16 (calc) (ref 6–22)
BUN: 23 mg/dL (ref 7–25)
CO2: 26 mmol/L (ref 20–32)
Calcium: 10.4 mg/dL (ref 8.6–10.4)
Chloride: 107 mmol/L (ref 98–110)
Creat: 1.4 mg/dL — ABNORMAL HIGH (ref 0.50–1.05)
Globulin: 3.2 g/dL (calc) (ref 1.9–3.7)
Glucose, Bld: 99 mg/dL (ref 65–99)
Potassium: 4.3 mmol/L (ref 3.5–5.3)
Sodium: 143 mmol/L (ref 135–146)
Total Bilirubin: 0.4 mg/dL (ref 0.2–1.2)
Total Protein: 7.7 g/dL (ref 6.1–8.1)
eGFR: 41 mL/min/{1.73_m2} — ABNORMAL LOW (ref >=60)

## 2022-09-13 LAB — LIPID PANEL W/REFLEX DIRECT LDL
Cholesterol: 298 mg/dL — ABNORMAL HIGH (ref <200)
HDL: 63 mg/dL (ref >=50)
LDL Cholesterol (Calc): 201 mg/dL (calc) — ABNORMAL HIGH
Non-HDL Cholesterol (Calc): 235 mg/dL (calc) — ABNORMAL HIGH (ref <130)
Total CHOL/HDL Ratio: 4.7 (calc) (ref <5.0)
Triglycerides: 172 mg/dL — ABNORMAL HIGH (ref <150)

## 2022-09-13 LAB — TSH: TSH: 4.45 mIU/L (ref 0.40–4.50)

## 2022-09-13 LAB — VITAMIN D 25 HYDROXY (VIT D DEFICIENCY, FRACTURES): Vit D, 25-Hydroxy: 33 ng/mL (ref 30–100)

## 2022-09-13 MED ORDER — LEVOTHYROXINE SODIUM 75 MCG PO TABS
75.0000 ug | ORAL_TABLET | Freq: Every day | ORAL | 0 refills | Status: DC
Start: 2022-09-13 — End: 2022-12-09

## 2022-09-13 NOTE — Progress Notes (Signed)
Susan Figueroa,  Vitamin D normal but very low normal. Make sure taking 2000 units daily with dairy.  Hemoglobin looks great. WBC looks great.  Thyroid is upper limits of normal moving towards HYPO thyroid and not in goal range. Suggest increase in synthroid to 24mg. Are you ok with dose increase and recheck in 6 weeks?  Kidney function has dropped some from 1 year ago. Avoid any NSAIDs. Make sure drinking plenty of water. I would like to suggest SGLT-2 that can preserve kidney function. Really important to keep BP controlled. Did you return for a nurse visit BP recheck?  HDL, good cholesterol, looks great.  LDL, bad cholesterol, is very high.   Your 10 year CV risk is also very high. It is strongly recommended that we try other statins or try statins every other day etc to decrease risk. Thoughts?   .Marland KitchenMarland Kitchenhe 10-year ASCVD risk score (Arnett DK, et al., 2019) is: 22.1%   Values used to calculate the score:     Age: [age]years     Sex: Female     Is Non-Hispanic African American: No     Diabetic: No     Tobacco smoker: No     Systolic Blood Pressure: [id number]     Is BP treated: Yes     HDL Cholesterol: 63 mg/dL     Total Cholesterol: 298 mg/dL

## 2022-09-14 MED ORDER — PITAVASTATIN CALCIUM 2 MG PO TABS: ORAL_TABLET | ORAL | 1 refills | Status: DC

## 2022-09-14 NOTE — Addendum Note (Signed)
Addended by: [name] on: 09/14/2022 04:03 PM   Modules accepted: Orders

## 2022-09-14 NOTE — Progress Notes (Signed)
What about trying livalo and statin but was developed to be metabolized through a different pathway. Maybe just start at 3 times a week?

## 2022-09-23 ENCOUNTER — Encounter: Payer: Self-pay | Admitting: Physician Assistant

## 2022-09-23 ENCOUNTER — Ambulatory Visit: Payer: No Typology Code available for payment source | Admitting: Physician Assistant

## 2022-09-23 VITALS — BP 114/82 | HR 79 | Temp 98.2°F | Ht 67.0 in | Wt 167.0 lb

## 2022-09-23 DIAGNOSIS — J40 Bronchitis, not specified as acute or chronic: Secondary | ICD-10-CM

## 2022-09-23 DIAGNOSIS — R051 Acute cough: Secondary | ICD-10-CM

## 2022-09-23 DIAGNOSIS — J029 Acute pharyngitis, unspecified: Secondary | ICD-10-CM | POA: Diagnosis not present

## 2022-09-23 DIAGNOSIS — J329 Chronic sinusitis, unspecified: Secondary | ICD-10-CM | POA: Diagnosis not present

## 2022-09-23 DIAGNOSIS — I10 Essential (primary) hypertension: Secondary | ICD-10-CM

## 2022-09-23 LAB — POCT INFLUENZA A/B
Influenza A, POC: NEGATIVE
Influenza B, POC: NEGATIVE

## 2022-09-23 LAB — POC COVID19 BINAXNOW: SARS Coronavirus 2 Ag: NEGATIVE

## 2022-09-23 MED ORDER — ALBUTEROL SULFATE HFA 108 (90 BASE) MCG/ACT IN AERS: 2.0000 | INHALATION_SPRAY | Freq: Four times a day (QID) | RESPIRATORY_TRACT | 0 refills | Status: DC | PRN

## 2022-09-23 MED ORDER — PREDNISONE 20 MG PO TABS: ORAL_TABLET | ORAL | 0 refills | Status: DC

## 2022-09-23 MED ORDER — AMOXICILLIN-POT CLAVULANATE 875-125 MG PO TABS: 1.0000 | ORAL_TABLET | Freq: Two times a day (BID) | ORAL | 0 refills | Status: DC

## 2022-09-23 MED ORDER — PROMETHAZINE-DM 6.25-15 MG/5ML PO SYRP: 5.0000 mL | ORAL_SOLUTION | Freq: Four times a day (QID) | ORAL | 0 refills | Status: DC | PRN

## 2022-09-23 NOTE — Progress Notes (Signed)
Established Patient Office Visit  Subjective   Patient ID: Susan Figueroa, female    DOB: [birth year]  Age: 72 y.o. MRN: [id number]  Chief Complaint  Patient presents with   Follow-up    HPI Pt is a 72 yo female with HTN who presents to the clinic for follow up. Medication was changed to zestoretic. She is doing fine on medication. Not checking BP. Denies any CP or palpitations.   She has had URI symptoms for the last 4 days including cough, congestion, head pressure, sinus drainage, fatigue. She has taken some dayquil for temporarily relief of symptoms. No fever, chills, body aches. Not tested for covid at home. She has not had flu shot this year or covid booster.   Patient Active Problem List   Diagnosis Date Noted   Post-menopausal 08/30/2022   Primary hypertension 08/30/2022   Statin myopathy 08/30/2022   Cerebrovascular small vessel disease 04/16/2021   Family history of stroke 04/16/2021   Dizziness 04/16/2021   Osteopenia 09/25/2019   Elevated serum creatinine 06/15/2018   Elevated blood pressure reading 06/15/2018   Elevated liver enzymes 06/15/2018   Colon adenoma 01/22/2018   Primary osteoarthritis of right knee 01/01/2018   Arthritis of carpometacarpal (CMC) joint of both thumbs 10/23/2017   Acute appendicitis with suppurative peritonitis s/p lap appendectomy 06/08/2017 06/08/2017   Pure hypercholesterolemia 06/07/2017   Chronic cystitis 04/15/2016   Dysuria 04/15/2016   Urinary incontinence in female 12/25/2015   Dry mouth 10/20/2015   Dry skin 10/20/2015   Sleep apnea 06/30/2015   Raynaud's disease without gangrene 05/04/2015   No energy 05/04/2015   Snoring 05/04/2015   CKD (chronic kidney disease) stage 3, GFR 30-59 ml/min (HCC) 04/10/2015   Hyperlipidemia 04/10/2015   Thyroid activity decreased 04/08/2015   Bilateral lower extremity edema 04/08/2015   Past Medical History:  Diagnosis Date   Acute appendicitis    Thyroid disease    Past Surgical  History:  Procedure Laterality Date   ABDOMINAL HYSTERECTOMY     LAPAROSCOPIC APPENDECTOMY N/A 06/07/2017   Procedure: APPENDECTOMY LAPAROSCOPIC;  Surgeon: Johnathan Hausen, MD;  Location: [facility];  Service: General;  Laterality: N/A;   Family History  Problem Relation Age of Onset   Cancer Mother        cervical   Hypertension Brother    Stroke Maternal Grandmother    Heart attack Maternal Grandfather    Allergies  Allergen Reactions   Lipitor [Atorvastatin]     myalgia      ROS   See HPI.  Objective:     BP 114/82   Pulse 79   Temp 98.2 F (36.8 C) (Oral)   Ht '5\' 7"'$  (1.702 m)   Wt 167 lb (75.8 kg)   SpO2 98%   BMI 26.16 kg/m  BP Readings from Last 3 Encounters:  09/23/22 114/82  08/29/22 (!) 166/95  08/09/21 138/90   Wt Readings from Last 3 Encounters:  09/23/22 167 lb (75.8 kg)  08/29/22 174 lb (78.9 kg)  08/09/21 162 lb (73.5 kg)      .[name] Results for orders placed or performed in visit on 09/23/22  POC COVID-19  Result Value Ref Range   SARS Coronavirus 2 Ag Negative Negative  POCT Influenza A/B  Result Value Ref Range   Influenza A, POC Negative Negative   Influenza B, POC Negative Negative    Physical Exam Constitutional:      Appearance: Normal appearance.  HENT:     Head: Normocephalic.  Right Ear: Tympanic membrane, ear canal and external ear normal. There is no impacted cerumen.     Left Ear: Tympanic membrane, ear canal and external ear normal. There is no impacted cerumen.     Nose: Congestion and rhinorrhea present.     Mouth/Throat:     Mouth: Mucous membranes are moist.     Pharynx: Posterior oropharyngeal erythema present. No oropharyngeal exudate.  Eyes:     Conjunctiva/sclera: Conjunctivae normal.     Comments: Watery bilateral eyes  Neck:     Vascular: No carotid bruit.  Cardiovascular:     Rate and Rhythm: Normal rate and regular rhythm.     Pulses: Normal pulses.  Pulmonary:     Effort: Pulmonary effort is normal.      Breath sounds: Normal breath sounds.     Comments: Wet cough on exam and worse with any deep breathing Musculoskeletal:     Cervical back: Normal range of motion and neck supple. No tenderness.     Right lower leg: No edema.     Left lower leg: No edema.  Lymphadenopathy:     Cervical: Cervical adenopathy present.  Neurological:     General: No focal deficit present.     Mental Status: She is alert and oriented to person, place, and time.  Psychiatric:        Mood and Affect: Mood normal.      The 10-year ASCVD risk score (Arnett DK, et al., 2019) is: 10.1%    Assessment & Plan:  Marland KitchenMarland KitchenJadi was seen today for follow-up.  Diagnoses and all orders for this visit:  Sinobronchitis -     albuterol (VENTOLIN HFA) 108 (90 Base) MCG/ACT inhaler; Inhale 2 puffs into the lungs every 6 (six) hours as needed. -     predniSONE (DELTASONE) 20 MG tablet; Take one tablet twice a day. -     promethazine-dextromethorphan (PROMETHAZINE-DM) 6.25-15 MG/5ML syrup; Take 5 mLs by mouth 4 (four) times daily as needed for cough. -     amoxicillin-clavulanate (AUGMENTIN) 875-125 MG tablet; Take 1 tablet by mouth 2 (two) times daily.  Acute cough -     POC COVID-19 -     POCT Influenza A/B  Sore throat -     POC COVID-19 -     POCT Influenza A/B  Primary hypertension   Discussed on Day 4 likely viral Covid and flu negative Reassuring vitals Continue symptomatic care with coricidin or mucinex Start albuterol as needed Start prednisone for 5 days Cough syrup given to use as needed Rest and hydrate if symptoms continue or worsen start augmentin for 10 day  BP looks great. Continue on zestoretic daily.      Iran Planas, PA-C

## 2022-09-23 NOTE — Patient Instructions (Addendum)
Regular mucinex   Coricidin- good for people with higher blood    Acute Bronchitis, Adult  Acute bronchitis is sudden inflammation of the main airways (bronchi) that come off the windpipe (trachea) in the lungs. The swelling causes the airways to get smaller and make more mucus than normal. This can make it hard to breathe and can cause coughing or noisy breathing (wheezing). Acute bronchitis may last several weeks. The cough may last longer. Allergies, asthma, and exposure to smoke may make the condition worse. What are the causes? This condition can be caused by germs and by substances that irritate the lungs, including: Cold and flu viruses. The most common cause of this condition is the virus that causes the common cold. Bacteria. This is less common. Breathing in substances that irritate the lungs, including: Smoke from cigarettes and other forms of tobacco. Dust and pollen. Fumes from household cleaning products, gases, or burned fuel. Indoor or outdoor air pollution. What increases the risk? The following factors may make you more likely to develop this condition: A weak body's defense system, also called the immune system. A condition that affects your lungs and breathing, such as asthma. What are the signs or symptoms? Common symptoms of this condition include: Coughing. This may bring up clear, yellow, or green mucus from your lungs (sputum). Wheezing. Runny or stuffy nose. Having too much mucus in your lungs (chest congestion). Shortness of breath. Aches and pains, including sore throat or chest. How is this diagnosed? This condition is usually diagnosed based on: Your symptoms and medical history. A physical exam. You may also have other tests, including tests to rule out other conditions, such as pneumonia. These tests include: A test of lung function. Test of a mucus sample to look for the presence of bacteria. Tests to check the oxygen level in your blood. Blood  tests. Chest X-ray. How is this treated? Most cases of acute bronchitis clear up over time without treatment. Your health care provider may recommend: Drinking more fluids to help thin your mucus so it is easier to cough up. Taking inhaled medicine (inhaler) to improve air flow in and out of your lungs. Using a vaporizer or a humidifier. These are machines that add water to the air to help you breathe better. Taking a medicine that thins mucus and clears congestion (expectorant). Taking a medicine that prevents or stops coughing (cough suppressant). It is not common to take an antibiotic medicine for this condition. Follow these instructions at home:  Take over-the-counter and prescription medicines only as told by your health care provider. Use an inhaler, vaporizer, or humidifier as told by your health care provider. Take two teaspoons (10 mL) of honey at bedtime to lessen coughing at night. Drink enough fluid to keep your urine pale yellow. Do not use any products that contain nicotine or tobacco. These products include cigarettes, chewing tobacco, and vaping devices, such as e-cigarettes. If you need help quitting, ask your health care provider. Get plenty of rest. Return to your normal activities as told by your health care provider. Ask your health care provider what activities are safe for you. Keep all follow-up visits. This is important. How is this prevented? To lower your risk of getting this condition again: Wash your hands often with soap and water for at least 20 seconds. If soap and water are not available, use hand sanitizer. Avoid contact with people who have cold symptoms. Try not to touch your mouth, nose, or eyes with your hands.  Avoid breathing in smoke or chemical fumes. Breathing smoke or chemical fumes will make your condition worse. Get the flu shot every year. Contact a health care provider if: Your symptoms do not improve after 2 weeks. You have trouble  coughing up the mucus. Your cough keeps you awake at night. You have a fever. Get help right away if you: Cough up blood. Feel pain in your chest. Have severe shortness of breath. Faint or keep feeling like you are going to faint. Have a severe headache. Have a fever or chills that get worse. These symptoms may represent a serious problem that is an emergency. Do not wait to see if the symptoms will go away. Get medical help right away. Call your local emergency services (911 in the [place] Do not drive yourself to the hospital. Summary Acute bronchitis is inflammation of the main airways (bronchi) that come off the windpipe (trachea) in the lungs. The swelling causes the airways to get smaller and make more mucus than normal. Drinking more fluids can help thin your mucus so it is easier to cough up. Take over-the-counter and prescription medicines only as told by your health care provider. Do not use any products that contain nicotine or tobacco. These products include cigarettes, chewing tobacco, and vaping devices, such as e-cigarettes. If you need help quitting, ask your health care provider. Contact a health care provider if your symptoms do not improve after 2 weeks. This information is not intended to replace advice given to you by your health care provider. Make sure you discuss any questions you have with your health care provider. Document Revised: 10/28/2021 Document Reviewed: 11/18/2020 Elsevier Patient Education  [place].

## 2022-10-15 ENCOUNTER — Other Ambulatory Visit: Payer: Self-pay | Admitting: Physician Assistant

## 2022-10-15 DIAGNOSIS — J329 Chronic sinusitis, unspecified: Secondary | ICD-10-CM

## 2022-11-23 ENCOUNTER — Other Ambulatory Visit: Payer: Self-pay | Admitting: Physician Assistant

## 2022-11-23 ENCOUNTER — Encounter: Payer: Self-pay | Admitting: Physician Assistant

## 2022-11-23 DIAGNOSIS — Z1231 Encounter for screening mammogram for malignant neoplasm of breast: Secondary | ICD-10-CM

## 2022-11-23 DIAGNOSIS — I10 Essential (primary) hypertension: Secondary | ICD-10-CM

## 2022-11-23 DIAGNOSIS — N1832 Chronic kidney disease, stage 3b: Secondary | ICD-10-CM

## 2022-11-23 MED ORDER — DAPAGLIFLOZIN PROPANEDIOL 10 MG PO TABS: 10.0000 mg | ORAL_TABLET | Freq: Every day | ORAL | 0 refills | Status: DC

## 2022-12-09 ENCOUNTER — Telehealth: Payer: Self-pay | Admitting: Physician Assistant

## 2022-12-09 ENCOUNTER — Other Ambulatory Visit: Payer: Self-pay | Admitting: Physician Assistant

## 2022-12-09 DIAGNOSIS — E039 Hypothyroidism, unspecified: Secondary | ICD-10-CM

## 2022-12-09 NOTE — Telephone Encounter (Signed)
Medication sent.

## 2022-12-09 NOTE — Telephone Encounter (Signed)
Patient request refills on levothyroxine 75 mcg tablet please submit to [facility] on [address] in [place]

## 2022-12-09 NOTE — Telephone Encounter (Signed)
Patient called she is scheduled for an appointment

## 2022-12-13 ENCOUNTER — Encounter: Payer: Self-pay | Admitting: Physician Assistant

## 2022-12-13 DIAGNOSIS — Z79899 Other long term (current) drug therapy: Secondary | ICD-10-CM

## 2022-12-14 ENCOUNTER — Ambulatory Visit: Payer: No Typology Code available for payment source | Admitting: Physician Assistant

## 2022-12-15 ENCOUNTER — Encounter: Payer: Self-pay | Admitting: Physician Assistant

## 2022-12-15 LAB — TEST AUTHORIZATION

## 2022-12-15 LAB — BASIC METABOLIC PANEL WITH GFR
BUN/Creatinine Ratio: 18 (calc) (ref 6–22)
BUN: 28 mg/dL — ABNORMAL HIGH (ref 7–25)
CO2: 26 mmol/L (ref 20–32)
Calcium: 9.5 mg/dL (ref 8.6–10.4)
Chloride: 104 mmol/L (ref 98–110)
Creat: 1.54 mg/dL — ABNORMAL HIGH (ref 0.60–1.00)
Glucose, Bld: 65 mg/dL (ref 65–99)
Potassium: 4.3 mmol/L (ref 3.5–5.3)
Sodium: 138 mmol/L (ref 135–146)
eGFR: 36 mL/min/{1.73_m2} — ABNORMAL LOW (ref >=60)

## 2022-12-15 LAB — TSH: TSH: 2.07 mIU/L (ref 0.40–4.50)

## 2022-12-16 ENCOUNTER — Other Ambulatory Visit: Payer: Self-pay | Admitting: Physician Assistant

## 2022-12-16 DIAGNOSIS — I10 Essential (primary) hypertension: Secondary | ICD-10-CM

## 2022-12-16 MED ORDER — LISINOPRIL-HYDROCHLOROTHIAZIDE 20-25 MG PO TABS: 1.0000 | ORAL_TABLET | Freq: Every day | ORAL | 1 refills | Status: DC

## 2022-12-16 NOTE — Progress Notes (Signed)
Kidneys look dry. How much are you drinking?  Kidney function down more than 3 months ago.  Stay on same medications and recheck BMP in 3 months.  Avoid any NSAIDs.  Drink at least 32oz of water a day.  Thyroid looks great.

## 2023-01-03 ENCOUNTER — Encounter: Payer: Self-pay | Admitting: Physician Assistant

## 2023-01-03 MED ORDER — FLUCONAZOLE 150 MG PO TABS: 150.0000 mg | ORAL_TABLET | Freq: Every day | ORAL | 1 refills | Status: DC

## 2023-01-11 ENCOUNTER — Ambulatory Visit: Payer: No Typology Code available for payment source

## 2023-01-17 ENCOUNTER — Ambulatory Visit: Payer: No Typology Code available for payment source

## 2023-01-17 DIAGNOSIS — Z1231 Encounter for screening mammogram for malignant neoplasm of breast: Secondary | ICD-10-CM

## 2023-01-20 ENCOUNTER — Other Ambulatory Visit: Payer: Self-pay | Admitting: Physician Assistant

## 2023-01-20 DIAGNOSIS — R928 Other abnormal and inconclusive findings on diagnostic imaging of breast: Secondary | ICD-10-CM

## 2023-01-20 NOTE — Progress Notes (Signed)
Left breast asymmetry detected. Imaging will reach out to schedule more imaging to further evaluate.

## 2023-01-26 ENCOUNTER — Other Ambulatory Visit: Payer: No Typology Code available for payment source

## 2023-01-31 ENCOUNTER — Other Ambulatory Visit: Payer: No Typology Code available for payment source

## 2023-02-06 ENCOUNTER — Encounter: Payer: Self-pay | Admitting: Physician Assistant

## 2023-02-06 MED ORDER — OMEPRAZOLE 20 MG PO CPDR: 20.0000 mg | DELAYED_RELEASE_CAPSULE | Freq: Every day | ORAL | 3 refills | Status: DC

## 2023-02-08 ENCOUNTER — Ambulatory Visit
Admission: RE | Admit: 2023-02-08 | Discharge: 2023-02-08 | Disposition: A | Discharge status: Home / Self Care | Payer: No Typology Code available for payment source | Source: Ambulatory Visit | Attending: Physician Assistant | Admitting: Physician Assistant

## 2023-02-08 ENCOUNTER — Ambulatory Visit: Payer: No Typology Code available for payment source

## 2023-02-08 DIAGNOSIS — R928 Other abnormal and inconclusive findings on diagnostic imaging of breast: Secondary | ICD-10-CM

## 2023-02-08 NOTE — Progress Notes (Signed)
Normal mammogram. Follow up yearly with screenings.

## 2023-02-28 ENCOUNTER — Other Ambulatory Visit: Payer: Self-pay | Admitting: Physician Assistant

## 2023-03-15 ENCOUNTER — Encounter: Payer: Self-pay | Admitting: Physician Assistant

## 2023-03-15 DIAGNOSIS — E039 Hypothyroidism, unspecified: Secondary | ICD-10-CM

## 2023-03-15 MED ORDER — LEVOTHYROXINE SODIUM 75 MCG PO TABS
75.0000 ug | ORAL_TABLET | Freq: Every day | ORAL | 2 refills | Status: DC
Start: 2023-03-15 — End: 2023-12-15

## 2023-03-29 ENCOUNTER — Ambulatory Visit: Payer: No Typology Code available for payment source | Admitting: Physician Assistant

## 2023-03-29 ENCOUNTER — Encounter: Payer: Self-pay | Admitting: Physician Assistant

## 2023-03-29 VITALS — BP 128/88 | HR 64 | Ht 67.0 in | Wt 176.5 lb

## 2023-03-29 DIAGNOSIS — E039 Hypothyroidism, unspecified: Secondary | ICD-10-CM

## 2023-03-29 DIAGNOSIS — E782 Mixed hyperlipidemia: Secondary | ICD-10-CM | POA: Diagnosis not present

## 2023-03-29 DIAGNOSIS — N1832 Chronic kidney disease, stage 3b: Secondary | ICD-10-CM

## 2023-03-29 DIAGNOSIS — I10 Essential (primary) hypertension: Secondary | ICD-10-CM

## 2023-03-29 MED ORDER — LISINOPRIL-HYDROCHLOROTHIAZIDE 20-25 MG PO TABS
1.0000 | ORAL_TABLET | Freq: Every day | ORAL | 1 refills | Status: DC
Start: 2023-03-29 — End: 2023-10-24

## 2023-03-29 NOTE — Progress Notes (Signed)
Established Patient Office Visit  Subjective   Patient ID: Susan Figueroa, female    DOB: 1951/06/12  Age: 72 y.o. MRN: [id number]  Chief Complaint  Patient presents with   Hypertension   Medication Consultation    Patient states she stopped Farxiga due to yeast infection.    HPI Pt is a 72 yo female with HTN, CKD, HLD, hypothyroidism who presents to the clinic for follow up.   She does not check BP very often but when she has it has been 120's over 80s. No CP, palpitations, headaches, dizziness or swelling. She is compliant with medications. She walks 30 minutes almost every day.   She could not tolerate farxiga due to yeast infections.   Not concerns with thyroid medication. Taking daily.   Taking livalo 3 times a week due to side effects with daily use.   .. Active Ambulatory Problems    Diagnosis Date Noted   Thyroid activity decreased 04/08/2015   Bilateral lower extremity edema 04/08/2015   CKD (chronic kidney disease) stage 3, GFR 30-59 ml/min (HCC) 04/10/2015   Hyperlipidemia 04/10/2015   Raynaud's disease without gangrene 05/04/2015   No energy 05/04/2015   Snoring 05/04/2015   Sleep apnea 06/30/2015   Dry mouth 10/20/2015   Dry skin 10/20/2015   Urinary incontinence in female 12/25/2015   Chronic cystitis 04/15/2016   Dysuria 04/15/2016   Pure hypercholesterolemia 06/07/2017   Acute appendicitis with suppurative peritonitis s/p lap appendectomy 06/08/2017 06/08/2017   Arthritis of carpometacarpal (CMC) joint of both thumbs 10/23/2017   Primary osteoarthritis of right knee 01/01/2018   Colon adenoma 01/22/2018   Elevated serum creatinine 06/15/2018   Elevated blood pressure reading 06/15/2018   Elevated liver enzymes 06/15/2018   Osteopenia 09/25/2019   Cerebrovascular small vessel disease 04/16/2021   Family history of stroke 04/16/2021   Dizziness 04/16/2021   Post-menopausal 08/30/2022   Primary hypertension 08/30/2022   Statin myopathy 08/30/2022    Resolved Ambulatory Problems    Diagnosis Date Noted   Weight gain 10/20/2015   Abdominal discomfort 04/15/2016   Leukocytes in urine 04/15/2016   Right lower quadrant abdominal pain 06/07/2017   Absent bowel sounds 06/07/2017   Left lower quadrant pain 06/07/2017   Abdominal guarding 06/07/2017   Status post laparoscopic appendectomy 06/08/2017   Past Medical History:  Diagnosis Date   Acute appendicitis    Thyroid disease      Review of Systems  All other systems reviewed and are negative.     Objective:     BP 128/88   Pulse 64   Ht 5\' 7"  (1.702 m)   Wt 176 lb 8 oz (80.1 kg)   SpO2 98%   BMI 27.64 kg/m  BP Readings from Last 3 Encounters:  03/29/23 128/88  09/23/22 114/82  08/29/22 (!) 166/95   Wt Readings from Last 3 Encounters:  03/29/23 176 lb 8 oz (80.1 kg)  09/23/22 167 lb (75.8 kg)  08/29/22 174 lb (78.9 kg)    ..    03/29/2023    8:17 AM 08/29/2022    4:05 PM 02/09/2021    4:25 PM 09/27/2019   11:17 AM 10/23/2017    8:57 AM  Depression screen PHQ 2/9  Decreased Interest 0 0 0 0 0  Down, Depressed, Hopeless 0 0 0 0 0  PHQ - 2 Score 0 0 0 0 0  Altered sleeping   0 0   Tired, decreased energy   0 0   Change in  appetite   0 0   Feeling bad or failure about yourself    0 0   Trouble concentrating   0 0   Moving slowly or fidgety/restless   0 0   Suicidal thoughts   0 0   PHQ-9 Score   0 0   Difficult doing work/chores   Not difficult at all Not difficult at all      Physical Exam Constitutional:      Appearance: Normal appearance.  HENT:     Head: Normocephalic.  Cardiovascular:     Rate and Rhythm: Normal rate and regular rhythm.     Pulses: Normal pulses.  Pulmonary:     Effort: Pulmonary effort is normal.     Breath sounds: Normal breath sounds.  Musculoskeletal:     Right lower leg: No edema.     Left lower leg: No edema.  Neurological:     General: No focal deficit present.     Mental Status: She is alert and oriented to  person, place, and time.  Psychiatric:        Mood and Affect: Mood normal.       The 10-year ASCVD risk score (Arnett DK, et al., 2019) is: 13.8%    Assessment & Plan:  Marland KitchenMarland KitchenKurtis was seen today for hypertension and medication consultation.  Diagnoses and all orders for this visit:  Primary hypertension -     CMP14+EGFR -     CBC w/Diff/Platelet  Stage 3b chronic kidney disease (HCC) -     CMP14+EGFR -     Lipid panel -     TSH -     CBC w/Diff/Platelet  Mixed hyperlipidemia -     Lipid panel  Acquired hypothyroidism -     TSH   BP improved a little on 2nd recheck Discussed goals of CKD and BP being the most important HO given Labs ordered for recheck today   Return in about 6 months (around 09/29/2023).    Tandy Gaw, PA-C

## 2023-03-29 NOTE — Patient Instructions (Signed)
Chronic Kidney Disease, Adult Chronic kidney disease is when lasting damage happens to the kidneys slowly over a long time. The kidneys help to: Make pee (urine). Make hormones. Keep the right amount of fluids and chemicals in the body. Most often, this disease does not go away. You must take steps to help keep the kidney damage from getting worse. If steps are not taken, the kidneys might stop working forever. What are the causes? Diabetes. High blood pressure. Diseases that affect the heart and blood vessels. Other kidney diseases. Diseases of the body's disease-fighting system. A problem with the flow of pee. Infections of the organs that make pee, store it, and take it out of the body. Swelling or irritation of your blood vessels. What increases the risk? Getting older. Having someone in your family who has kidney disease or kidney failure. Having a disease caused by genes. Taking medicines often that harm the kidneys. Being near or having contact with harmful substances. Being very overweight. Using tobacco now or in the past. What are the signs or symptoms? Feeling very tired. Having a swollen face, legs, ankles, or feet. Feeling like you may vomit or vomiting. Not feeling hungry. Being confused or not able to focus. Twitches and cramps in the leg muscles or other muscles. Dry, itchy skin. A taste of metal in your mouth. Making less pee, or making more pee. Shortness of breath. Trouble sleeping. You may also become anemic or get weak bones. Anemic means there is not enough red blood cells or hemoglobin in your blood. You may get symptoms slowly. You may not notice them until the kidney damage gets very bad. How is this treated? Often, there is no cure for this disease. Treatment can help with symptoms and help keep the disease from getting worse. You may need to: Avoid alcohol. Avoid foods that are high in salt, potassium, phosphorous, and protein. Take medicines for  symptoms and to help control other conditions. Have dialysis. This treatment gets harmful waste out of your body. Treat other problems that cause your kidney disease or make it worse. Follow these instructions at home: Medicines Take over-the-counter and prescription medicines only as told by your doctor. Do not take any new medicines, vitamins, or supplements unless your doctor says it is okay. Lifestyle  Do not smoke or use any products that contain nicotine or tobacco. If you need help quitting, ask your doctor. If you drink alcohol: Limit how much you use to: 0-1 drink a day for women who are not pregnant. 0-2 drinks a day for men. Know how much alcohol is in your drink. In the [place] one drink equals one 12 oz bottle of beer (355 mL), one 5 oz glass of wine (148 mL), or one 1 oz glass of hard liquor (44 mL). Stay at a healthy weight. If you need help losing weight, ask your doctor. General instructions  Follow instructions from your doctor about what you cannot eat or drink. Track your blood pressure at home. Tell your doctor about any changes. If you have diabetes, track your blood sugar. Exercise at least 30 minutes a day, 5 days a week. Keep your shots (vaccinations) up to date. Keep all follow-up visits. Where to find more information American Association of Kidney Patients: ResidentialShow.is [employer]: www.kidney.org American Kidney Fund: FightingMatch.com.ee Life Options: www.lifeoptions.org Kidney School: www.kidneyschool.org Contact a doctor if: Your symptoms get worse. You get new symptoms. Get help right away if: You get symptoms of end-stage kidney disease. These  include: Headaches. Losing feeling in your hands or feet. Easy bruising. Having hiccups often. Chest pain. Shortness of breath. Lack of menstrual periods, in women. You have a fever. You make less pee than normal. You have pain or you bleed when you pee or poop. These symptoms may be an  emergency. Get help right away. Call your local emergency services (911 in the [place] Do not wait to see if the symptoms will go away. Do not drive yourself to the hospital. Summary Chronic kidney disease is when lasting damage happens to the kidneys slowly over a long time. Causes of this disease include diabetes and high blood pressure. Often, there is no cure for this disease. Treatment can help symptoms and help keep the disease from getting worse. Treatment may involve lifestyle changes, medicines, and dialysis. This information is not intended to replace advice given to you by your health care provider. Make sure you discuss any questions you have with your health care provider. Document Revised: 10/23/2019 Document Reviewed: 10/23/2019 Elsevier Patient Education  2024 Elsevier Inc. Food Basics for Chronic Kidney Disease Chronic kidney disease (CKD) is when your kidneys are not working well. They cannot remove waste, fluids, and other substances from your blood the way they should. These substances can build up, which can worsen kidney damage and affect how your body works. Eating certain foods can lead to a buildup of these substances. Changing your diet can help prevent more kidney damage. Diet changes may also delay dialysis or even keep you from needing it. What nutrients should I limit? Work with your treatment team and a food expert (dietitian) to make a meal plan that's right for you. Foods you can eat and foods you should limit or avoid will depend on the stage of your kidney disease and any other health conditions you have. The items listed below are not a complete list. Talk with your dietitian to learn what is best for you. Potassium Potassium affects how steadily your heart beats. Too much potassium in your blood can cause an irregular heartbeat or even a heart attack. You may need to limit foods that are high in potassium, such as: Liquid milk and soy milk. Salt substitutes  that contain potassium. Fruits like bananas, apricots, nectarines, melon, prunes, raisins, kiwi, and oranges. Vegetables, such as potatoes, sweet potatoes, yams, tomatoes, leafy greens, beets, avocado, pumpkin, and winter squash. Beans, like lima beans. Nuts. Phosphorus Phosphorus is a mineral found in your bones. You need a balance between calcium and phosphorus to build and maintain healthy bones. Too much added phosphorus from the foods you eat can pull calcium from your bones. Losing calcium can make your bones weak and more likely to break. Too much phosphorus can also make your skin itch. You may need to limit foods that are high in phosphorus or that have added phosphorus, such as: Liquid milk and dairy products. Dark-colored sodas or soft drinks. Bran cereals and oatmeal. Protein  Protein helps you make and keep muscle. Protein also helps to repair your body's cells and tissues. One of the natural breakdown products of protein is a waste product called urea. When your kidneys are not working well, they cannot remove types of waste like urea. Reducing protein in your diet can help keep urea from building up in your blood. Depending on your stage of kidney disease, you may need to eat smaller portions of foods that are high in protein. Sources of animal protein include: Meat (all types). Fish and seafood.  Poultry. Eggs. Dairy. Other protein foods include: Beans and legumes. Nuts and nut butter. Soy, like tofu.  Sodium Salt (sodium) helps to keep a healthy balance of fluids in your body. Too much salt can increase your blood pressure, which can harm your heart and lungs. Extra salt can also cause your body to keep too much fluid, making your kidneys work harder. You may need to limit or avoid foods that are high in salt, such as: Salt seasonings. Soy and teriyaki sauce. Packaged, precooked, cured, or processed meats, such as sausages or meat loaves. Sardines. Salted crackers and  snack foods. Fast food. Canned soups and most canned foods. Pickled foods. Vegetable juice. Boxed mixes or ready-to-eat boxed meals and side dishes. Bottled dressings, sauces, and marinades. Talk with your dietitian about how much potassium, phosphorus, protein, and salt you may have each day. Helpful tips Read food labels  Check the amount of salt in foods. Limit foods that have salt or sodium listed among the first five ingredients. Try to eat low-salt foods. Check the ingredient list for added phosphorus or potassium. "Phos" in an ingredient is a sign that phosphorus has been added. Do not buy foods that are calcium-enriched or that have calcium added to them (are fortified). Buy canned vegetables and beans that say "no salt added" and rinse them before eating. Lifestyle Limit the amount of protein you eat from animal sources each day. Focus on protein from plant sources, like tofu and dried beans, peas, and lentils. Do not add salt to food when cooking or before eating. Do not eat star fruit. It can be toxic for people with kidney problems. Talk with your health care provider before taking any vitamin or mineral supplements. If told by your health care provider, track how much liquid you drink so you can avoid drinking too much. You may need to include foods you eat that are made mostly from water, like gelatin, ice cream, soups, and juicy fruits and vegetables. If you have diabetes: If you have diabetes (diabetes mellitus) and CKD, you need to keep your blood sugar (glucose) in the target range recommended by your health care provider. Follow your diabetes management plan. This may include: Checking your blood glucose regularly. Taking medicines by mouth, or taking insulin, or both. Exercising for at least 30 minutes on 5 or more days each week, or as told by your health care provider. Tracking how many servings of carbohydrates you eat at each meal. Not using orange juice to treat  low blood sugars. Instead, use apple juice, cranberry juice, or clear soda. You may be given guidelines on what foods and nutrients you may eat, and how much you can have each day. This depends on your stage of kidney disease and whether you have high blood pressure (hypertension). Follow the meal plan your dietitian gives you. To learn more: [employer] of Diabetes and Digestive and Kidney Diseases: StageSync.si [employer]: kidney.org Summary Chronic kidney disease (CKD) is when your kidneys are not working well. They cannot remove waste, fluids, and other substances from your blood the way they should. These substances can build up, which can worsen kidney damage and affect how your body works. Changing your diet can help prevent more kidney damage. Diet changes may also delay dialysis or even keep you from needing it. Diet changes are different for each person with CKD. Work with a dietitian to set up a meal plan that is right for you. This information is not intended to  replace advice given to you by your health care provider. Make sure you discuss any questions you have with your health care provider. Document Revised: 11/04/2021 Document Reviewed: 11/11/2019 Elsevier Patient Education  2024 [employer].

## 2023-03-30 LAB — CBC WITH DIFFERENTIAL/PLATELET
Basophils Absolute: 0 10*3/uL (ref 0.0–0.2)
Basos: 1 %
EOS (ABSOLUTE): 0.1 10*3/uL (ref 0.0–0.4)
Eos: 1 %
Hematocrit: 38.2 % (ref 34.0–46.6)
Hemoglobin: 12.3 g/dL (ref 11.1–15.9)
Immature Grans (Abs): 0 10*3/uL (ref 0.0–0.1)
Immature Granulocytes: 0 %
Lymphocytes Absolute: 1.8 10*3/uL (ref 0.7–3.1)
Lymphs: 33 %
MCH: 29.3 pg (ref 26.6–33.0)
MCHC: 32.2 g/dL (ref 31.5–35.7)
MCV: 91 fL (ref 79–97)
Monocytes Absolute: 0.7 10*3/uL (ref 0.1–0.9)
Monocytes: 13 %
Neutrophils Absolute: 2.9 10*3/uL (ref 1.4–7.0)
Neutrophils: 52 %
Platelets: 266 10*3/uL (ref 150–450)
RBC: 4.2 x10E6/uL (ref 3.77–5.28)
RDW: 12.4 % (ref 11.7–15.4)
WBC: 5.5 10*3/uL (ref 3.4–10.8)

## 2023-03-30 LAB — CMP14+EGFR
ALT: 16 IU/L (ref 0–32)
AST: 20 IU/L (ref 0–40)
Albumin: 4.2 g/dL (ref 3.9–4.9)
Alkaline Phosphatase: 86 IU/L (ref 44–121)
BUN/Creatinine Ratio: 15 (ref 12–28)
BUN: 22 mg/dL (ref 8–27)
Bilirubin Total: 0.3 mg/dL (ref 0.0–1.2)
CO2: 22 mmol/L (ref 20–29)
Calcium: 10 mg/dL (ref 8.7–10.3)
Chloride: 105 mmol/L (ref 96–106)
Creatinine, Ser: 1.51 mg/dL — ABNORMAL HIGH (ref 0.57–1.00)
Globulin, Total: 2.6 g/dL (ref 1.5–4.5)
Glucose: 91 mg/dL (ref 70–99)
Potassium: 4.3 mmol/L (ref 3.5–5.2)
Sodium: 141 mmol/L (ref 134–144)
Total Protein: 6.8 g/dL (ref 6.0–8.5)
eGFR: 37 mL/min/{1.73_m2} — ABNORMAL LOW (ref >59)

## 2023-03-30 LAB — LIPID PANEL
Chol/HDL Ratio: 4.2 ratio (ref 0.0–4.4)
Cholesterol, Total: 252 mg/dL — ABNORMAL HIGH (ref 100–199)
HDL: 60 mg/dL (ref >39)
LDL Chol Calc (NIH): 168 mg/dL — ABNORMAL HIGH (ref 0–99)
Triglycerides: 136 mg/dL (ref 0–149)
VLDL Cholesterol Cal: 24 mg/dL (ref 5–40)

## 2023-03-30 LAB — TSH: TSH: 2.21 u[IU]/mL (ref 0.450–4.500)

## 2023-03-30 NOTE — Progress Notes (Signed)
Damon,   Thyroid looks great.  Kidney function is stable.  LdL is down from 200 at last check. Do you think you could tolerate every other day? Have you tried that?

## 2023-04-06 ENCOUNTER — Encounter: Payer: Self-pay | Admitting: Physician Assistant

## 2023-06-04 ENCOUNTER — Other Ambulatory Visit: Payer: Self-pay | Admitting: Family Medicine

## 2023-09-04 ENCOUNTER — Ambulatory Visit: Payer: No Typology Code available for payment source | Admitting: Physician Assistant

## 2023-09-04 IMAGING — MG MM DIGITAL SCREENING BILAT W/ TOMO AND CAD
6 of 10 series · 6 of 30 positions shown · non-contrast
Comparison: Previous exam(s).

CLINICAL DATA: Screening.

EXAM:
DIGITAL SCREENING BILATERAL MAMMOGRAM WITH TOMOSYNTHESIS AND CAD
TECHNIQUE: Bilateral screening digital craniocaudal and mediolateral oblique
mammograms were obtained. Bilateral screening digital breast
tomosynthesis was performed. The images were evaluated with
computer-aided detection.

[L CC synth-2D]
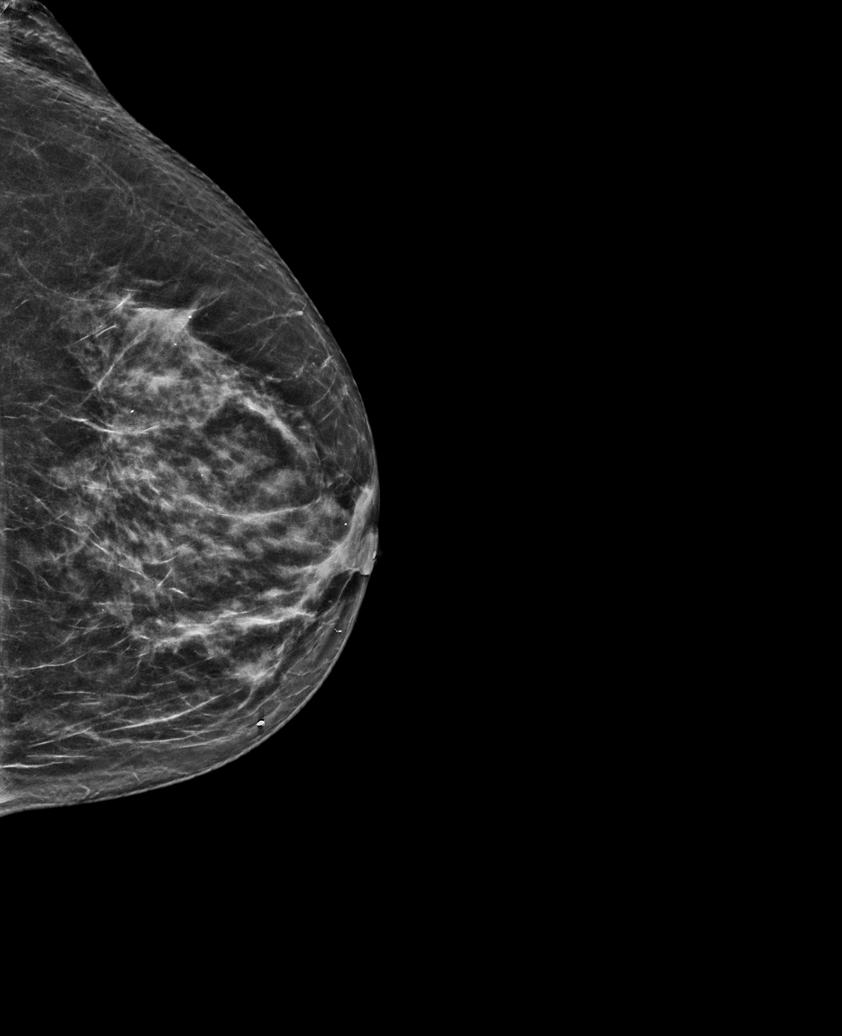

[R CC synth-2D]
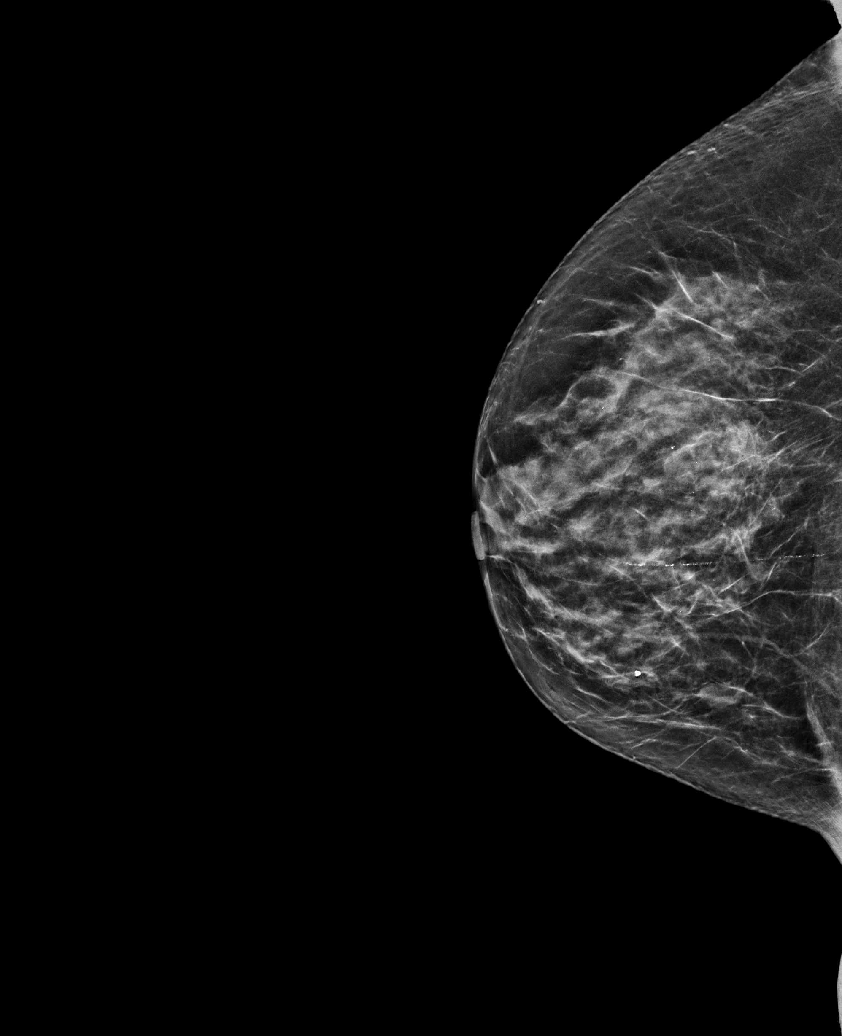

[L MLO synth-2D (1 of 2)]
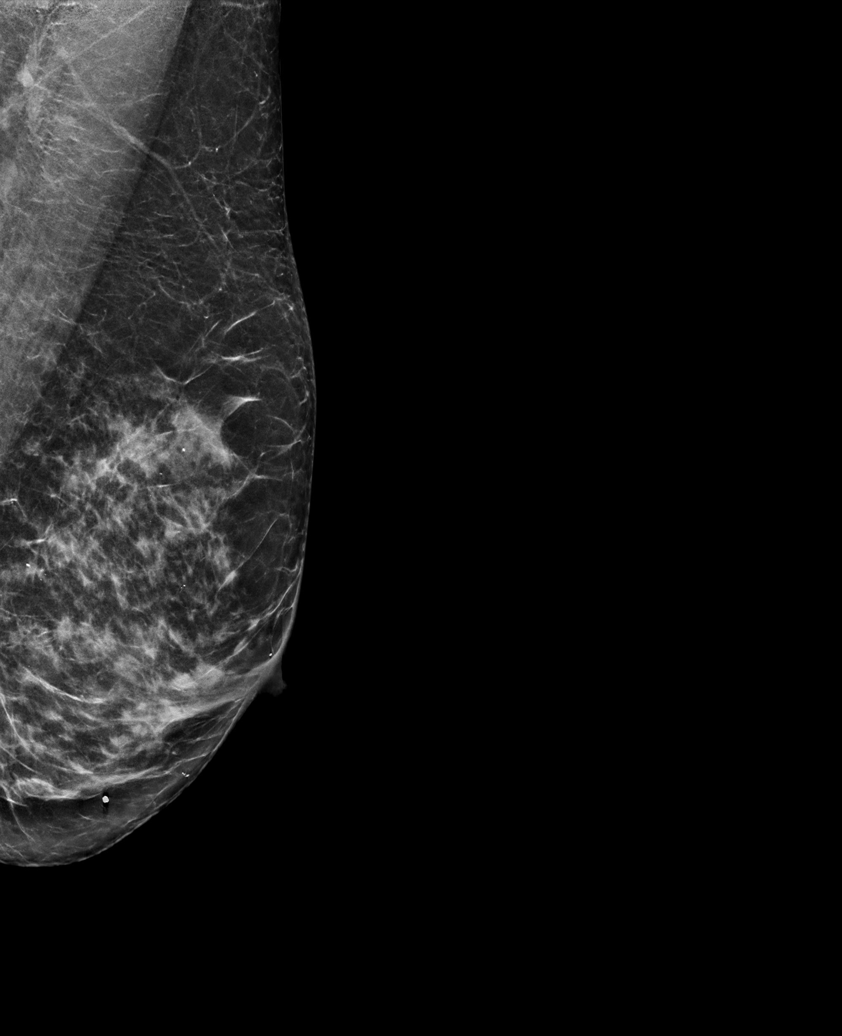

[L MLO synth-2D (2 of 2)]
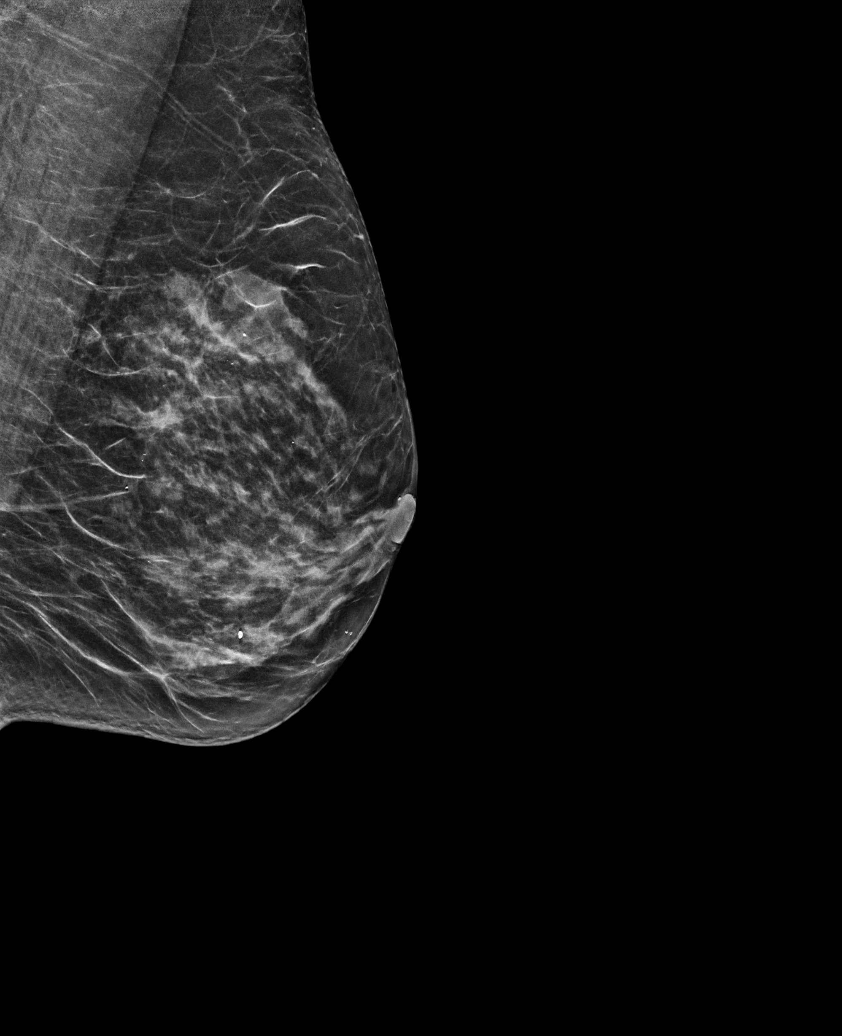

[R MLO synth-2D]
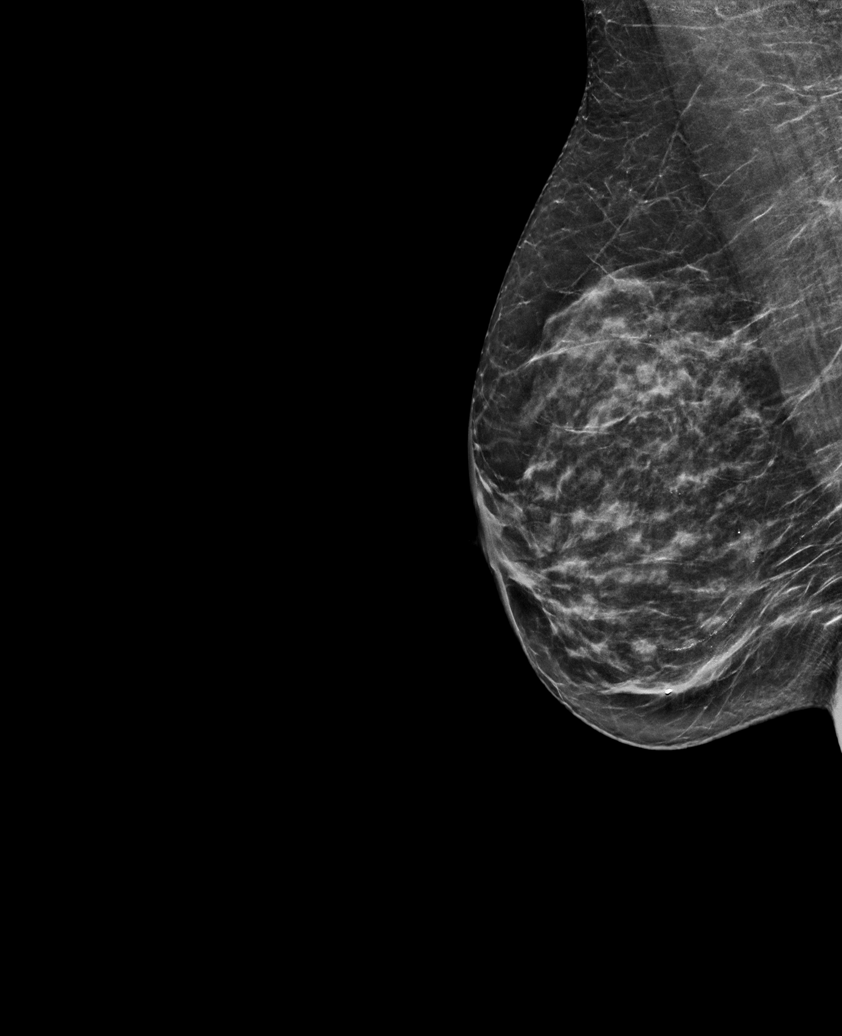

[L MLO tomo · tomo slice 32/63.0]
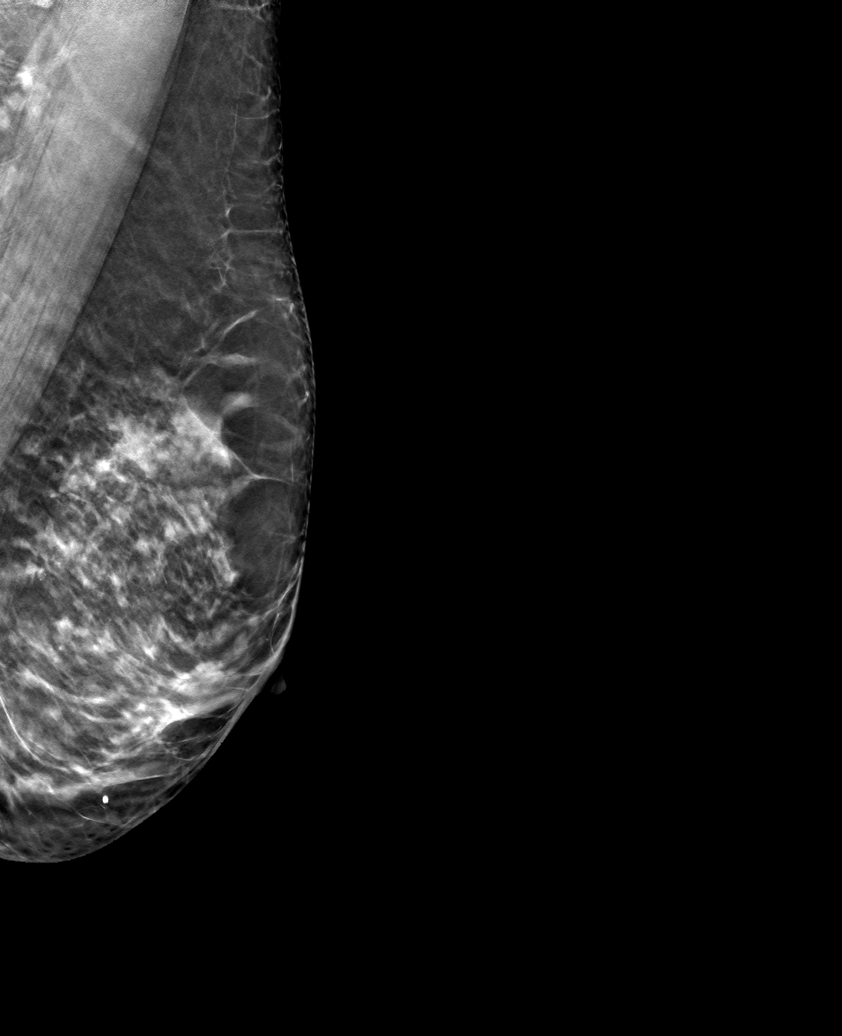

[6 of 30 positions shown; findings below may reference images not displayed]

ACR Breast Density Category c: The breast tissue is heterogeneously
dense, which may obscure small masses.
FINDINGS: There are no findings suspicious for malignancy.
IMPRESSION: No mammographic evidence of malignancy. A result letter of this
screening mammogram will be mailed directly to the patient.

RECOMMENDATION:
Screening mammogram in one year. (Code:[id number])

BI-RADS CATEGORY  1: Negative.

## 2023-09-29 ENCOUNTER — Ambulatory Visit: Payer: No Typology Code available for payment source | Admitting: Physician Assistant

## 2023-10-17 ENCOUNTER — Encounter: Payer: Self-pay | Admitting: Physician Assistant

## 2023-10-17 MED ORDER — OMEPRAZOLE 20 MG PO CPDR: 20.0000 mg | DELAYED_RELEASE_CAPSULE | Freq: Every day | ORAL | 3 refills | Status: DC

## 2023-10-24 ENCOUNTER — Encounter: Payer: Self-pay | Admitting: Physician Assistant

## 2023-10-24 ENCOUNTER — Ambulatory Visit: Payer: No Typology Code available for payment source | Admitting: Physician Assistant

## 2023-10-24 VITALS — BP 120/70 | HR 68 | Temp 98.3°F | Ht 67.5 in | Wt 181.0 lb

## 2023-10-24 DIAGNOSIS — E663 Overweight: Secondary | ICD-10-CM | POA: Insufficient documentation

## 2023-10-24 DIAGNOSIS — E039 Hypothyroidism, unspecified: Secondary | ICD-10-CM

## 2023-10-24 DIAGNOSIS — G720 Drug-induced myopathy: Secondary | ICD-10-CM

## 2023-10-24 DIAGNOSIS — E782 Mixed hyperlipidemia: Secondary | ICD-10-CM | POA: Diagnosis not present

## 2023-10-24 DIAGNOSIS — T466X5A Adverse effect of antihyperlipidemic and antiarteriosclerotic drugs, initial encounter: Secondary | ICD-10-CM

## 2023-10-24 DIAGNOSIS — N1832 Chronic kidney disease, stage 3b: Secondary | ICD-10-CM | POA: Diagnosis not present

## 2023-10-24 DIAGNOSIS — Z780 Asymptomatic menopausal state: Secondary | ICD-10-CM

## 2023-10-24 DIAGNOSIS — K219 Gastro-esophageal reflux disease without esophagitis: Secondary | ICD-10-CM | POA: Insufficient documentation

## 2023-10-24 DIAGNOSIS — I679 Cerebrovascular disease, unspecified: Secondary | ICD-10-CM

## 2023-10-24 DIAGNOSIS — I10 Essential (primary) hypertension: Secondary | ICD-10-CM

## 2023-10-24 MED ORDER — REPATHA SURECLICK 140 MG/ML ~~LOC~~ SOAJ: 140.0000 mg | SUBCUTANEOUS | 5 refills | Status: DC

## 2023-10-24 MED ORDER — OMEPRAZOLE 20 MG PO CPDR: 20.0000 mg | DELAYED_RELEASE_CAPSULE | Freq: Every day | ORAL | 3 refills | Status: DC

## 2023-10-24 MED ORDER — LISINOPRIL-HYDROCHLOROTHIAZIDE 20-25 MG PO TABS: 1.0000 | ORAL_TABLET | Freq: Every day | ORAL | 1 refills | Status: DC

## 2023-10-24 NOTE — Progress Notes (Signed)
 Established Patient Office Visit  Subjective   Patient ID: Susan Figueroa, female    DOB: 1951-06-19  Age: 73 y.o. MRN: [id number]  Chief Complaint  Patient presents with   Medical Management of Chronic Issues    6 mo Primary hypertension    HPI Pt is a 73 yo female who presents to the clinic for 6 month follow up.   HTN- taking lisinopril/hydrochlorothiazide daily. No concerns. Not checking BP at home. Denies any CP, palpitations, headaches, or vision changes.   Thyroid is controlled and taking synthroid daily.   GERD controlled with omeprazole.   Stopped pitavastatin every other day with muscle cramps and pains.    Review of Systems  All other systems reviewed and are negative.     Objective:     BP (!) 127/92   Pulse 68   Temp 98.3 F (36.8 C) (Oral)   Ht 5' 7.5" (1.715 m)   Wt 181 lb (82.1 kg)   SpO2 99%   BMI 27.93 kg/m  BP Readings from Last 3 Encounters:  10/24/23 (!) 127/92  03/29/23 128/88  09/23/22 114/82   Wt Readings from Last 3 Encounters:  10/24/23 181 lb (82.1 kg)  03/29/23 176 lb 8 oz (80.1 kg)  09/23/22 167 lb (75.8 kg)      Physical Exam Constitutional:      Appearance: Normal appearance.  HENT:     Head: Normocephalic.  Cardiovascular:     Rate and Rhythm: Normal rate and regular rhythm.     Pulses: Normal pulses.     Heart sounds: Normal heart sounds.  Pulmonary:     Effort: Pulmonary effort is normal.     Breath sounds: Normal breath sounds.  Musculoskeletal:     Cervical back: Normal range of motion and neck supple. No tenderness.     Right lower leg: No edema.     Left lower leg: No edema.  Lymphadenopathy:     Cervical: No cervical adenopathy.  Neurological:     General: No focal deficit present.     Mental Status: She is alert and oriented to person, place, and time.  Psychiatric:        Mood and Affect: Mood normal.       The 10-year ASCVD risk score (Arnett DK, et al., 2019) is: 13.1%    Assessment & Plan:   Marland KitchenMarland KitchenCeciley was seen today for medical management of chronic issues.  Diagnoses and all orders for this visit:  Stage 3b chronic kidney disease (HCC) -     CBC w/Diff/Platelet -     CMP14+EGFR  Mixed hyperlipidemia -     CBC w/Diff/Platelet -     Lipid panel -     Evolocumab (REPATHA SURECLICK) 140 MG/ML SOAJ; Inject 140 mg into the skin every 14 (fourteen) days.  Acquired hypothyroidism -     CBC w/Diff/Platelet -     TSH + free T4  Statin myopathy -     CBC w/Diff/Platelet -     Evolocumab (REPATHA SURECLICK) 140 MG/ML SOAJ; Inject 140 mg into the skin every 14 (fourteen) days.  Primary hypertension -     CBC w/Diff/Platelet -     CMP14+EGFR -     lisinopril-hydrochlorothiazide (ZESTORETIC) 20-25 MG tablet; Take 1 tablet by mouth daily.  Post-menopausal -     CBC w/Diff/Platelet -     VITAMIN D 25 Hydroxy (Vit-D Deficiency, Fractures) -     B12 and Folate Panel  Overweight  Gastroesophageal reflux  disease without esophagitis -     omeprazole (PRILOSEC) 20 MG capsule; Take 1 capsule (20 mg total) by mouth daily.   Not able to tolerate statins Failed pitavastatin, atorvastatin, pravastatin Even at every other day had muscle pain and cramping LdL ordered for recheck today Repatha ordered Discussed how to use Stay on ASA 81mg  daily Follow up in 6 months  Refilled omeprazole for GERD.   Discussed CKD Recheck kidney function today Failed SGLT-2 On ACE BP to goal  ..Discussed low carb diet with 1500 calories and 80g of protein.  Exercising at least 150 minutes a week.  My Fitness Pal could be a Chief Technology Officer.  Slenderiix drops to consider      Return in about 6 months (around 04/25/2024).    Tandy Gaw, PA-C

## 2023-10-24 NOTE — Patient Instructions (Signed)
 Will try to get repatha approved for cholesterol and cardiovascular disease reduction.

## 2023-10-25 ENCOUNTER — Encounter: Payer: Self-pay | Admitting: Physician Assistant

## 2023-10-25 LAB — CBC WITH DIFFERENTIAL/PLATELET
Basophils Absolute: 0 10*3/uL (ref 0.0–0.2)
Basos: 1 %
EOS (ABSOLUTE): 0.2 10*3/uL (ref 0.0–0.4)
Eos: 3 %
Hematocrit: 35.4 % (ref 34.0–46.6)
Hemoglobin: 11.9 g/dL (ref 11.1–15.9)
Immature Grans (Abs): 0 10*3/uL (ref 0.0–0.1)
Immature Granulocytes: 0 %
Lymphocytes Absolute: 2.2 10*3/uL (ref 0.7–3.1)
Lymphs: 34 %
MCH: 29.8 pg (ref 26.6–33.0)
MCHC: 33.6 g/dL (ref 31.5–35.7)
MCV: 89 fL (ref 79–97)
Monocytes Absolute: 0.8 10*3/uL (ref 0.1–0.9)
Monocytes: 12 %
Neutrophils Absolute: 3.3 10*3/uL (ref 1.4–7.0)
Neutrophils: 50 %
Platelets: 289 10*3/uL (ref 150–450)
RBC: 3.99 x10E6/uL (ref 3.77–5.28)
RDW: 12.7 % (ref 11.7–15.4)
WBC: 6.5 10*3/uL (ref 3.4–10.8)

## 2023-10-25 LAB — CMP14+EGFR
ALT: 16 IU/L (ref 0–32)
AST: 19 IU/L (ref 0–40)
Albumin: 4.1 g/dL (ref 3.9–4.9)
Alkaline Phosphatase: 114 IU/L (ref 44–121)
BUN/Creatinine Ratio: 11 — ABNORMAL LOW (ref 12–28)
BUN: 19 mg/dL (ref 8–27)
Bilirubin Total: <0.2 mg/dL (ref 0.0–1.2)
CO2: 21 mmol/L (ref 20–29)
Calcium: 9.6 mg/dL (ref 8.7–10.3)
Chloride: 108 mmol/L — ABNORMAL HIGH (ref 96–106)
Creatinine, Ser: 1.67 mg/dL — ABNORMAL HIGH (ref 0.57–1.00)
Globulin, Total: 2.4 g/dL (ref 1.5–4.5)
Glucose: 92 mg/dL (ref 70–99)
Potassium: 4.5 mmol/L (ref 3.5–5.2)
Sodium: 144 mmol/L (ref 134–144)
Total Protein: 6.5 g/dL (ref 6.0–8.5)
eGFR: 33 mL/min/{1.73_m2} — ABNORMAL LOW (ref >59)

## 2023-10-25 LAB — B12 AND FOLATE PANEL
Folate: >20 ng/mL
Vitamin B-12: 487 pg/mL (ref 232–1245)

## 2023-10-25 LAB — LIPID PANEL
Chol/HDL Ratio: 4.9 ratio — ABNORMAL HIGH (ref 0.0–4.4)
Cholesterol, Total: 266 mg/dL — ABNORMAL HIGH (ref 100–199)
HDL: 54 mg/dL (ref >39)
LDL Chol Calc (NIH): 190 mg/dL — ABNORMAL HIGH (ref 0–99)
Triglycerides: 123 mg/dL (ref 0–149)
VLDL Cholesterol Cal: 22 mg/dL (ref 5–40)

## 2023-10-25 LAB — TSH+FREE T4
Free T4: 1.48 ng/dL (ref 0.82–1.77)
TSH: 2.96 u[IU]/mL (ref 0.450–4.500)

## 2023-10-25 LAB — VITAMIN D 25 HYDROXY (VIT D DEFICIENCY, FRACTURES): Vit D, 25-Hydroxy: 26.5 ng/mL — ABNORMAL LOW (ref 30.0–100.0)

## 2023-10-25 NOTE — Progress Notes (Signed)
 Susan Figueroa,   B12 looks good.  Thyroid looks great.  Vitamin D not to goal. Increase to 2000 units of D3 daily with dairy for better absorption.   GFR has dropped a little more. I don't know that nephrology would have anything more to offer but would you like consult.   LDL up to 190 being off statin.   CV risk elevated. Repatha has been sent to pharmacy.   [name].The 10-year ASCVD risk score (Arnett DK, et al., 2019) is: 12.2%   Values used to calculate the score:     Age: 73 years     Sex: Female     Is Non-Hispanic African American: No     Diabetic: No     Tobacco smoker: No     Systolic Blood Pressure: 120 mmHg     Is BP treated: Yes     HDL Cholesterol: 54 mg/dL     Total Cholesterol: 266 mg/dL

## 2023-11-09 ENCOUNTER — Telehealth: Payer: Self-pay | Admitting: *Deleted

## 2023-11-09 DIAGNOSIS — Z1231 Encounter for screening mammogram for malignant neoplasm of breast: Secondary | ICD-10-CM

## 2023-11-09 NOTE — Telephone Encounter (Signed)
 Copied from CRM [id number]. Topic: Appointments - Appointment Scheduling >> Nov 09, 2023 10:51 AM Yvone Neu wrote: Patient wants to schedule a mammogram, patients callback number is [phone].

## 2023-11-09 NOTE — Telephone Encounter (Signed)
 Order placed for screening mammogram. Called and spoke with pt letting her know this had been done and she verbalized understanding. Nothing further needed.

## 2023-11-29 ENCOUNTER — Ambulatory Visit: Payer: Self-pay | Admitting: *Deleted

## 2023-11-29 NOTE — Telephone Encounter (Signed)
  Chief Complaint: cough- productive- 2 weeks Symptoms: cough, nasal drainage, increased mucus in am Frequency: 2 weeks Pertinent Negatives: Patient denies fever- thought may have had at beginning Disposition: [] ED /[] Urgent Care (no appt availability in office) / [x] Appointment(In office/virtual)/ []  [facility]/ [] Home Care/ [] Refused Recommended Disposition /[] [place] [facility]/ []  Follow-up with PCP   Copied from CRM [id number]. Topic: Clinical - Red Word Triage >> Nov 29, 2023 12:46 PM Brittney F wrote: [facility] that prompted transfer to Nurse Triage:   Patient has been having upper respiratory issues for two weeks; discolored mucous (slightly yellow); most mornings she doesn't have a voice  Symptoms:   Congestion Progressive cough Spot the size of the point of her index finger  in throat; made it impossible to swallow(present for one day and then went away) Reason for Disposition  Cough has been present for > 3 weeks  Answer Assessment - Initial Assessment Questions 1. ONSET: "When did the cough begin?"      2 weeks 2. SEVERITY: "How bad is the cough today?"      Annoying- wakes to cough at night 3. SPUTUM: "Describe the color of your sputum" (none, dry cough; clear, white, yellow, green)     Slightly yellow- normally doesn't look at sputum 4. HEMOPTYSIS: "Are you coughing up any blood?" If so ask: "How much?" (flecks, streaks, tablespoons, etc.)     na 5. DIFFICULTY BREATHING: "Are you having difficulty breathing?" If Yes, ask: "How bad is it?" (e.g., mild, moderate, severe)    - MILD: No SOB at rest, mild SOB with walking, speaks normally in sentences, can lie down, no retractions, pulse < 100.    - MODERATE: SOB at rest, SOB with minimal exertion and prefers to sit, cannot lie down flat, speaks in phrases, mild retractions, audible wheezing, pulse 100-120.    - SEVERE: Very SOB at rest, speaks in single words, struggling to breathe, sitting hunched forward,  retractions, pulse > 120      normal 6. FEVER: "Do you have a fever?" If Yes, ask: "What is your temperature, how was it measured, and when did it start?"     No- maybe 2 days 7. CARDIAC HISTORY: "Do you have any history of heart disease?" (e.g., heart attack, congestive heart failure)      no 8. LUNG HISTORY: "Do you have any history of lung disease?"  (e.g., pulmonary embolus, asthma, emphysema)     no 9. PE RISK FACTORS: "Do you have a history of blood clots?" (or: recent major surgery, recent prolonged travel, bedridden)     no 10. OTHER SYMPTOMS: "Do you have any other symptoms?" (e.g., runny nose, wheezing, chest pain)       Congestion- post nasal drip  Protocols used: Cough - Acute Productive-A-AH

## 2023-11-29 NOTE — Telephone Encounter (Signed)
 Patient has upcoming appt schld with Dr. Augustus Ledger for Nov 30, 2023.

## 2023-11-30 ENCOUNTER — Ambulatory Visit: Admitting: Family Medicine

## 2023-12-10 ENCOUNTER — Other Ambulatory Visit: Payer: Self-pay | Admitting: Physician Assistant

## 2023-12-10 DIAGNOSIS — E039 Hypothyroidism, unspecified: Secondary | ICD-10-CM

## 2024-02-20 ENCOUNTER — Telehealth: Payer: Self-pay

## 2024-02-20 NOTE — Telephone Encounter (Signed)
 Copied from CRM [id number]. Topic: Clinical - Request for Lab/Test Order >> Feb 19, 2024  1:21 PM Farrel B wrote: Reason for CRM: [phone] ([phone] (W) patient is calling to set up scheduling for a mammogram please call patient at the above number to have her scheduled for her annual

## 2024-02-20 NOTE — Telephone Encounter (Signed)
 Last MMG - June 2024 had some asymmetry patient had a second MMG in July 2024 which returned normal - forwarding for review for ordering

## 2024-02-21 NOTE — Telephone Encounter (Signed)
 Looks like mammogram order is in.

## 2024-02-27 NOTE — Telephone Encounter (Signed)
 Called patient. Gave number to [facility] dept  [phone] To schedule MMG.

## 2024-03-28 ENCOUNTER — Ambulatory Visit

## 2024-04-19 ENCOUNTER — Encounter: Payer: Self-pay | Admitting: Physician Assistant

## 2024-04-25 ENCOUNTER — Encounter: Payer: Self-pay | Admitting: Physician Assistant

## 2024-04-26 ENCOUNTER — Encounter: Payer: Self-pay | Admitting: Physician Assistant

## 2024-04-26 ENCOUNTER — Ambulatory Visit

## 2024-04-26 ENCOUNTER — Ambulatory Visit: Admitting: Physician Assistant

## 2024-04-26 VITALS — BP 124/83 | HR 69 | Temp 98.5°F | Ht 67.5 in | Wt 181.0 lb

## 2024-04-26 DIAGNOSIS — N1832 Chronic kidney disease, stage 3b: Secondary | ICD-10-CM | POA: Diagnosis not present

## 2024-04-26 DIAGNOSIS — S97112A Crushing injury of left great toe, initial encounter: Secondary | ICD-10-CM | POA: Diagnosis not present

## 2024-04-26 DIAGNOSIS — I10 Essential (primary) hypertension: Secondary | ICD-10-CM | POA: Diagnosis not present

## 2024-04-26 DIAGNOSIS — T466X5A Adverse effect of antihyperlipidemic and antiarteriosclerotic drugs, initial encounter: Secondary | ICD-10-CM

## 2024-04-26 DIAGNOSIS — G720 Drug-induced myopathy: Secondary | ICD-10-CM

## 2024-04-26 DIAGNOSIS — E782 Mixed hyperlipidemia: Secondary | ICD-10-CM

## 2024-04-26 DIAGNOSIS — E039 Hypothyroidism, unspecified: Secondary | ICD-10-CM

## 2024-04-26 DIAGNOSIS — I679 Cerebrovascular disease, unspecified: Secondary | ICD-10-CM

## 2024-04-26 DIAGNOSIS — Z8249 Family history of ischemic heart disease and other diseases of the circulatory system: Secondary | ICD-10-CM | POA: Insufficient documentation

## 2024-04-26 DIAGNOSIS — E559 Vitamin D deficiency, unspecified: Secondary | ICD-10-CM | POA: Insufficient documentation

## 2024-04-26 DIAGNOSIS — Z23 Encounter for immunization: Secondary | ICD-10-CM

## 2024-04-26 MED ORDER — LISINOPRIL-HYDROCHLOROTHIAZIDE 20-25 MG PO TABS: 1.0000 | ORAL_TABLET | Freq: Every day | ORAL | 1 refills | Status: AC

## 2024-04-26 NOTE — Progress Notes (Signed)
 Established Patient Office Visit  Subjective   Patient ID: Susan Figueroa, female    DOB: 23-Mar-1951  Age: 73 y.o. MRN: [id number]  Chief Complaint  Patient presents with   Follow-up    6 month - HTN HDL   Toe Injury    Left Great Toe    HPI Discussed the use of AI scribe software for clinical note transcription with the patient, who gave verbal consent to proceed.  History of Present Illness Susan Figueroa is a 73 year old female who presents with left great toe pain following trauma. She also needs refills on thyroid  and blood pressure medication.   Left great toe pain and trauma - Recurrent trauma to the left great toe, most recently from a bathroom vanity drawer falling on it - Previous injury to the same toe from a dropped shampoo bottle - Pain localized to the tip of the left great toe - Pain is exacerbated by touch or wearing shoes - Unable to wear regular shoes due to pain; using alternative footwear to avoid pressure - Bruising present, more pronounced on the medial aspect of the toe and wrapping around - No pain at the base of the toe - Able to bend the toe slightly without significant pain - No pain when the toe is not touched or under pressure  Pt wants to discuss her cardiovascular risk due her father and brother having heart attacks and strokes in their 70s. She denies any CP, palpitations, SOB. She is very active. She has elevated cholesterol but does not tolerate statins. She does not want to take repatha  due to side effects she has read about. She is taking daily ASA.   She takes thyroid  medication daily with no problems. She takes her lisinopril /hydrochlorothiazide  daily. She is not checking her BP at home.    Review of Systems  All other systems reviewed and are negative.     Objective:     BP 124/83 (BP Location: Right Arm, Patient Position: Sitting, Cuff Size: Normal)   Pulse 69   Temp 98.5 F (36.9 C) (Oral)   Ht 5' 7.5 (1.715 m)   Wt 181 lb  (82.1 kg)   SpO2 99%   BMI 27.93 kg/m  BP Readings from Last 3 Encounters:  04/26/24 124/83  10/24/23 120/70  03/29/23 128/88   Wt Readings from Last 3 Encounters:  04/26/24 181 lb (82.1 kg)  10/24/23 181 lb (82.1 kg)  03/29/23 176 lb 8 oz (80.1 kg)      Physical Exam Constitutional:      Appearance: Normal appearance.  HENT:     Head: Normocephalic.  Neck:     Vascular: No carotid bruit.  Cardiovascular:     Rate and Rhythm: Normal rate and regular rhythm.     Heart sounds: Normal heart sounds.  Pulmonary:     Effort: Pulmonary effort is normal.     Breath sounds: Normal breath sounds.  Musculoskeletal:     Cervical back: Normal range of motion and neck supple.     Right lower leg: No edema.     Left lower leg: No edema.     Comments: Bruising under left toe with tenderness to palpation over distal toe but not over foot. Pedal pulses 2+.   Neurological:     General: No focal deficit present.     Mental Status: She is alert and oriented to person, place, and time.  Psychiatric:        Mood and  Affect: Mood normal.      The 10-year ASCVD risk score (Arnett DK, et al., 2019) is: 14.2%    Assessment & Plan:  .Susan Figueroa was seen today for follow-up and toe injury.  Diagnoses and all orders for this visit:  Primary hypertension -     CMP14+EGFR -     lisinopril -hydrochlorothiazide  (ZESTORETIC ) 20-25 MG tablet; Take 1 tablet by mouth daily.  Mixed hyperlipidemia  Immunization due -     Flu vaccine HIGH DOSE PF(Fluzone Trivalent)  Statin myopathy  Stage 3b chronic kidney disease (HCC) -     CMP14+EGFR  Acquired hypothyroidism -     TSH + free T4  Cerebrovascular small vessel disease  Vitamin D  deficiency -     VITAMIN D  25 Hydroxy (Vit-D Deficiency, Fractures)  Family history of cardiovascular disease -     CT CARDIAC SCORING (SELF PAY ONLY); Future  Crushing injury of left great toe, initial encounter -     DG Foot Complete Left;  Future    Assessment & Plan Left great toe injury with possible fracture Sustained injury from drawer impact, significant bruising and tenderness, possible fracture considered. - Order X-ray of left great toe. - Provide post-op shoe for 4 weeks. Pt can get on [employer].   Mixed hyperlipidemia Intolerance to statins, LDL levels suboptimal, increased cardiovascular risk, family history of cardiovascular events. Discussed lifestyle modifications and non-statin medications. - Discuss CT calcium  score for cardiovascular risk assessment. Ordered today.  - Pt declined repatha  due to side effects - Provide information on Zetia for LDL reduction. She will consider.  - Encourage diet and exercise. - Discussed elevated CV risk.   [name].The 10-year ASCVD risk score (Arnett DK, et al., 2019) is: 14.2%   Values used to calculate the score:     Age: 73 years     Clincally relevant sex: Female     Is Non-Hispanic African American: No     Diabetic: No     Tobacco smoker: No     Systolic Blood Pressure: 124 mmHg     Is BP treated: Yes     HDL Cholesterol: 54 mg/dL     Total Cholesterol: 266 mg/dL  Hypertension - BP looks great today - continue lisionpril/hydrochlorothiazide  daily  Hypothyroidism On levothyroxine , no issues with thyroid  management. - Will check TSH today and adjust medication accordingly.  General Health Maintenance Generally well, regular walking for exercise, declined COVID-19 vaccine, Flu shot given today.  - vitamin D  recheck today.    Return in about 6 months (around 10/24/2024).    Jeaninne Lodico, PA-C

## 2024-04-26 NOTE — Patient Instructions (Addendum)
 Ordered CT coronary calcium  score Consider zetia for cholesterol reduction Start ASA 81mg  a day   Ezetimibe Tablets What is this medication? EZETIMIBE (ez ET i mibe) treats high cholesterol. It works by reducing the amount of cholesterol absorbed from the food you eat. This decreases the amount of bad cholesterol (such as LDL) in your blood. Changes to diet and exercise are often combined with this medication. This medicine may be used for other purposes; ask your health care provider or pharmacist if you have questions. COMMON BRAND NAME(S): Zetia What should I tell my care team before I take this medication? They need to know if you have any of these conditions: Kidney disease Liver disease Muscle cramps, pain Muscle injury Thyroid  disease An unusual or allergic reaction to ezetimibe, other medications, foods, dyes, or preservatives Pregnant or trying to get pregnant Breast-feeding How should I use this medication? Take this medication by mouth. Take it as directed on the prescription label at the same time every day. You can take it with or without food. If it upsets your stomach, take it with food. Keep taking it unless your care team tells you to stop. Take bile acid sequestrants at a different time of day than this medication. Take this medication 2 hours BEFORE or 4 hours AFTER bile acid sequestrants. Talk to your care team about the use of this medication in children. While it may be prescribed for children as young as 10 for selected conditions, precautions do apply. Overdosage: If you think you have taken too much of this medicine contact a poison control center or emergency room at once. NOTE: This medicine is only for you. Do not share this medicine with others. What if I miss a dose? If you miss a dose, take it as soon as you can. If it is almost time for your next dose, take only that dose. Do not take double or extra doses. What may interact with this medication? Do not  take this medication with any of the following: Fenofibrate Gemfibrozil This medication may also interact with the following: Antacids Cyclosporine Herbal medications like red yeast rice Other medications to lower cholesterol or triglycerides This list may not describe all possible interactions. Give your health care provider a list of all the medicines, herbs, non-prescription drugs, or dietary supplements you use. Also tell them if you smoke, drink alcohol, or use illegal drugs. Some items may interact with your medicine. What should I watch for while using this medication? Visit your care team for regular checks on your progress. Tell your care team if your symptoms do not start to get better or if they get worse. Your care team may tell you to stop taking this medication if you develop muscle problems. If your muscle problems do not go away after stopping this medication, contact your care team. Talk to your care team if you wish to become pregnant or think you might be pregnant. This medication can cause serious birth defects if taken during pregnancy. Talk to your care team before breastfeeding. Changes to your treatment plan may be needed. Taking this medication is only part of a total heart healthy program. Ask your care team if there are other changes you can make to improve your overall health. What side effects may I notice from receiving this medication? Side effects that you should report to your doctor or health care provider as soon as possible: Allergic reactions--skin rash, itching or hives, swelling of the face, lips, tongue, or throat Side  effects that usually do not require medical attention (report to your doctor or health care provider if they continue or are bothersome): Diarrhea Joint pain This list may not describe all possible side effects. Call your doctor for medical advice about side effects. You may report side effects to FDA at 1-800-FDA-1088. Where should I keep  my medication? Keep out of the reach of children and pets. Store at room temperature between 15 and 30 degrees C (59 and 86 degrees F). Protect from moisture. Get rid of any unused medication after the expiration date. NOTE: This sheet is a summary. It may not cover all possible information. If you have questions about this medicine, talk to your doctor, pharmacist, or health care provider.  2024 Elsevier/Gold Standard (2022-04-26 00:00:00)

## 2024-04-27 LAB — CMP14+EGFR
ALT: 18 IU/L (ref 0–32)
AST: 21 IU/L (ref 0–40)
Albumin: 4.2 g/dL (ref 3.8–4.8)
Alkaline Phosphatase: 108 IU/L (ref 49–135)
BUN/Creatinine Ratio: 15 (ref 12–28)
BUN: 23 mg/dL (ref 8–27)
Bilirubin Total: 0.2 mg/dL (ref 0.0–1.2)
CO2: 21 mmol/L (ref 20–29)
Calcium: 9.8 mg/dL (ref 8.7–10.3)
Chloride: 102 mmol/L (ref 96–106)
Creatinine, Ser: 1.49 mg/dL — ABNORMAL HIGH (ref 0.57–1.00)
Globulin, Total: 2.7 g/dL (ref 1.5–4.5)
Glucose: 73 mg/dL (ref 70–99)
Potassium: 4.6 mmol/L (ref 3.5–5.2)
Sodium: 140 mmol/L (ref 134–144)
Total Protein: 6.9 g/dL (ref 6.0–8.5)
eGFR: 37 mL/min/1.73 — ABNORMAL LOW (ref >59)

## 2024-04-27 LAB — TSH+FREE T4
Free T4: 1.41 ng/dL (ref 0.82–1.77)
TSH: 2.06 u[IU]/mL (ref 0.450–4.500)

## 2024-04-27 LAB — VITAMIN D 25 HYDROXY (VIT D DEFICIENCY, FRACTURES): Vit D, 25-Hydroxy: 36.1 ng/mL (ref 30.0–100.0)

## 2024-04-28 ENCOUNTER — Ambulatory Visit: Payer: Self-pay | Admitting: Physician Assistant

## 2024-04-28 NOTE — Progress Notes (Signed)
 Susan Figueroa,   Kidney function stable.  Thyroid  looks great.  Vitamin D  improved and in normal range.

## 2024-04-30 ENCOUNTER — Other Ambulatory Visit

## 2024-05-01 NOTE — Progress Notes (Signed)
 Great toe fracture but non-displaced. Stay in post op shoe for 4 weeks until not painful to bear weight.

## 2024-05-02 ENCOUNTER — Encounter: Payer: Self-pay | Admitting: Physician Assistant

## 2024-05-02 ENCOUNTER — Other Ambulatory Visit: Payer: Self-pay

## 2024-05-02 ENCOUNTER — Ambulatory Visit

## 2024-05-02 DIAGNOSIS — Z1231 Encounter for screening mammogram for malignant neoplasm of breast: Secondary | ICD-10-CM

## 2024-05-02 DIAGNOSIS — Z8249 Family history of ischemic heart disease and other diseases of the circulatory system: Secondary | ICD-10-CM

## 2024-05-03 ENCOUNTER — Telehealth: Payer: Self-pay

## 2024-05-03 MED ORDER — EZETIMIBE 10 MG PO TABS: 10.0000 mg | ORAL_TABLET | Freq: Every day | ORAL | 3 refills | Status: AC

## 2024-05-03 MED ORDER — ROSUVASTATIN CALCIUM 5 MG PO TABS: ORAL_TABLET | ORAL | 5 refills | Status: AC

## 2024-05-03 NOTE — Telephone Encounter (Signed)
 Patient returned call. She has already viewed on MyChart and thought I call for new information.  Seen by patient Susan Figueroa on 04/30/2024  1:59 PM

## 2024-05-03 NOTE — Telephone Encounter (Signed)
 Copied from CRM [id number]. Topic: General - Call Back - No Documentation >> May 03, 2024  8:22 AM Susan Figueroa wrote: Reason for CRM: Patient returning call from [facility]. Is requesting a callback.  Patient can be reached at [phone]

## 2024-05-03 NOTE — Progress Notes (Signed)
 Calcium  score did show some calcium  in left main and left anterior descending artery. Do you think you could try zetia daily and then a statin 3 times a week? If you still have myalgias even decrease to once a week?

## 2024-05-07 ENCOUNTER — Ambulatory Visit: Payer: Self-pay | Admitting: Physician Assistant

## 2024-05-07 ENCOUNTER — Other Ambulatory Visit

## 2024-05-07 NOTE — Progress Notes (Signed)
Normal mammogram. Follow up in 1-2 years.

## 2024-05-13 NOTE — Progress Notes (Signed)
 CT shows some calcium  deposits in left main and left anterior descending meeting the 72 percentile for age. Starting statin 3 times a week is a great choice. Lets see how you tolerate. Let me know if every start to develop chest pain or shortness of breath.

## 2024-05-30 ENCOUNTER — Encounter: Payer: Self-pay | Admitting: Physician Assistant

## 2024-05-30 DIAGNOSIS — K219 Gastro-esophageal reflux disease without esophagitis: Secondary | ICD-10-CM

## 2024-05-30 MED ORDER — OMEPRAZOLE 20 MG PO CPDR
20.0000 mg | DELAYED_RELEASE_CAPSULE | Freq: Every day | ORAL | 1 refills | Status: AC
Start: 2024-05-30 — End: ?

## 2024-07-08 ENCOUNTER — Encounter: Payer: Self-pay | Admitting: Physician Assistant

## 2024-07-08 DIAGNOSIS — M25551 Pain in right hip: Secondary | ICD-10-CM

## 2024-07-09 ENCOUNTER — Other Ambulatory Visit: Payer: Self-pay

## 2024-07-09 ENCOUNTER — Ambulatory Visit

## 2024-07-09 VITALS — BP 140/80 | Ht 67.5 in | Wt 181.0 lb

## 2024-07-09 DIAGNOSIS — M25551 Pain in right hip: Secondary | ICD-10-CM

## 2024-07-09 DIAGNOSIS — M7071 Other bursitis of hip, right hip: Secondary | ICD-10-CM

## 2024-07-09 MED ORDER — METHYLPREDNISOLONE ACETATE 40 MG/ML IJ SUSP
40.0000 mg | Freq: Once | INTRAMUSCULAR | Status: AC
  Administered 2024-07-09: 40 mg via INTRA_ARTICULAR

## 2024-07-09 NOTE — Progress Notes (Signed)
 Subjective:    Patient ID: Susan Figueroa, female    DOB: 73 y.o., [birth year]   MRN: [id number]  Chief Complaint: Right hip pain  Discussed the use of AI scribe software for clinical note transcription with the patient, who gave verbal consent to proceed.  History of Present Illness Patient is a 73 year old female with past medical history significant for Raynaud's disease, hypertension, sleep apnea, GERD, osteopenia, CKD 3 presenting with right hip pain.  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  Objective:   There were no vitals filed for this visit.  Right hip (compared to normal) -Inspection: No swelling or skin changes. No leg length discrepancy. No gait abnormalities with walking. -Palpation: TTP - level ASIS, - level AIIS, - adductor, - greater trochanter, - SI joint, - over piriformis, + lateral aspect of ischial tuberosity -AROM/PROM: Flexion 120 deg, abduction 35 deg, adduction 25 deg, extension 15 deg, ER 50 deg, IR 30  deg, low hamstring flexibility -Strength: 5/5 flexion, 5/5 abduction, 5/5 adduction, 5/5 extension -Special tests: -  FADIR, -  log roll, - Thomas (though this vaguely flares her pain in the aforementioned region), - Ober, - Noble, - FABER, - piriformis testing, - SLR, - hop test   Limited ultrasound of the right lower extremity: There is a prominent sharp appearing hyper echogenic horn to the  ischial tuberosity with a surrounding area of hypoechogenic fluid focus.  No increased Doppler uptake within the hypoechogenic pocket.  No significant Doppler uptake surrounding this.  Biceps femoris tendon is well-visualized and appears to have a mildly hyper echogenic and heterogeneous appearance near its origin.  The semimembranosus and semitendinosis tendons are well-visualized and appear normal. Impression: Right sided ischial bursitis with enthesophytic change noted at ischial tuberosity  Right ischial bursa Injection with Ultrasound Guidance Procedure Note Susan Figueroa Dec 14, 1950 Indications: Pain Procedure Details Following the description of risks including infection, bleeding, and damage to surrounding structures patient provided written consent for right ischial bursa injection with ultrasound guidance. Ultrasound was used to identify the right ischial bursa. The patient was then prepped in the usual fashion with chlorhexidine. Following topical anesthetization with ethyl chloride, the patient was injected into the right ischial bursa with a solution of 40 mg Depo-Medrol , 2 ml of mepivacaine 2%, and 0.5 mL of sodium bicarbonate 4.2 %. This was well visualized under ultrasound, please see associated photographic documentation. Patient tolerated well without complication. Precautions provided. Cleaned and dressing applied.     Assessment & Plan:   Assessment & Plan Ischial bursitis, right hip   Chronic right hip pain, worsened by prolonged sitting, aligns with ischial bursitis. Pain is localized to the ischial tuberosity, described as burning and tender, especially when sitting. No recent injury or significant change in activity. Examination suggests ischial bursitis over hamstring tendinitis due to pain localization and nature. Arthritis is unlikely as there is no joint involvement. Performed ultrasound of the right hip to  assess the bursa and administered a corticosteroid injection to the  right ischial bursa.  Follow-up as needed.

## 2024-07-22 ENCOUNTER — Telehealth: Payer: Self-pay | Admitting: Physician Assistant

## 2024-07-22 DIAGNOSIS — E039 Hypothyroidism, unspecified: Secondary | ICD-10-CM

## 2024-07-22 NOTE — Telephone Encounter (Signed)
 Copied from CRM [id number]. Topic: Clinical - Medication Refill >> Jul 22, 2024 12:18 PM Sophia H wrote: Medication: levothyroxine  (SYNTHROID ) 75 MCG tablet   Has the patient contacted their pharmacy? Yes, out of fills.   This is the patient's preferred pharmacy:  [facility] [id number] - [place], [place] - [address] [place] [address] Phone: [phone] Fax: [phone]  Is this the correct pharmacy for this prescription? Yes If no, delete pharmacy and type the correct one.   Has the prescription been filled recently? Yes  Is the patient out of the medication? Yes  Has the patient been seen for an appointment in the last year OR does the patient have an upcoming appointment? Yes, seen back in September.  Can we respond through MyChart? Yes  Agent: Please be advised that Rx refills may take up to 3 business days. We ask that you follow-up with your pharmacy.

## 2024-07-23 MED ORDER — LEVOTHYROXINE SODIUM 75 MCG PO TABS: 75.0000 ug | ORAL_TABLET | Freq: Every day | ORAL | 2 refills | Status: AC

## 2024-09-23 ENCOUNTER — Ambulatory Visit: Admitting: Cardiovascular Disease

## 2024-10-25 ENCOUNTER — Ambulatory Visit: Admitting: Physician Assistant
